# Patient Record
Sex: Female | Born: 1950
Health system: Southern US, Community
[De-identification: ages and names within clinical notes are randomized; demographics above are authoritative.]

## PROBLEM LIST (undated history)

## (undated) DIAGNOSIS — I10 Essential (primary) hypertension: Secondary | ICD-10-CM

## (undated) DIAGNOSIS — F17200 Nicotine dependence, unspecified, uncomplicated: Secondary | ICD-10-CM

## (undated) DIAGNOSIS — C801 Malignant (primary) neoplasm, unspecified: Secondary | ICD-10-CM

## (undated) DIAGNOSIS — E785 Hyperlipidemia, unspecified: Secondary | ICD-10-CM

## (undated) DIAGNOSIS — M1990 Unspecified osteoarthritis, unspecified site: Secondary | ICD-10-CM

## (undated) DIAGNOSIS — Z8489 Family history of other specified conditions: Secondary | ICD-10-CM

## (undated) HISTORY — PX: BLADDER SUSPENSION: SHX72

---

## 1998-04-27 ENCOUNTER — Other Ambulatory Visit: Admission: RE | Admit: 1998-04-27 | Discharge: 1998-04-27 | Payer: Self-pay | Admitting: Obstetrics and Gynecology

## 1999-09-27 ENCOUNTER — Other Ambulatory Visit: Admission: RE | Admit: 1999-09-27 | Discharge: 1999-09-27 | Payer: Self-pay | Admitting: Obstetrics and Gynecology

## 2001-12-11 ENCOUNTER — Other Ambulatory Visit: Admission: RE | Admit: 2001-12-11 | Discharge: 2001-12-11 | Payer: Self-pay | Admitting: Obstetrics and Gynecology

## 2001-12-17 ENCOUNTER — Ambulatory Visit (HOSPITAL_COMMUNITY): Admission: RE | Admit: 2001-12-17 | Discharge: 2001-12-17 | Payer: Self-pay | Admitting: Pulmonary Disease

## 2001-12-17 ENCOUNTER — Encounter: Payer: Self-pay | Admitting: Internal Medicine

## 2002-02-06 ENCOUNTER — Encounter (INDEPENDENT_AMBULATORY_CARE_PROVIDER_SITE_OTHER): Payer: Self-pay

## 2002-02-06 ENCOUNTER — Ambulatory Visit (HOSPITAL_COMMUNITY): Admission: RE | Admit: 2002-02-06 | Discharge: 2002-02-06 | Payer: Self-pay | Admitting: Gastroenterology

## 2002-03-28 ENCOUNTER — Encounter: Payer: Self-pay | Admitting: Urology

## 2002-03-28 ENCOUNTER — Encounter: Admission: RE | Admit: 2002-03-28 | Discharge: 2002-03-28 | Payer: Self-pay | Admitting: Urology

## 2004-04-07 ENCOUNTER — Other Ambulatory Visit: Admission: RE | Admit: 2004-04-07 | Discharge: 2004-04-07 | Payer: Self-pay | Admitting: Obstetrics and Gynecology

## 2009-07-27 ENCOUNTER — Ambulatory Visit (HOSPITAL_COMMUNITY): Admission: RE | Admit: 2009-07-27 | Discharge: 2009-07-27 | Payer: Self-pay | Admitting: Obstetrics and Gynecology

## 2010-08-15 LAB — URINALYSIS, ROUTINE W REFLEX MICROSCOPIC
Bilirubin Urine: NEGATIVE
Glucose, UA: NEGATIVE mg/dL
Hgb urine dipstick: NEGATIVE
Ketones, ur: NEGATIVE mg/dL
Nitrite: NEGATIVE
Protein, ur: NEGATIVE mg/dL
Specific Gravity, Urine: 1.01 (ref 1.005–1.030)
Urobilinogen, UA: 0.2 mg/dL (ref 0.0–1.0)
pH: 7 (ref 5.0–8.0)

## 2010-08-15 LAB — CBC
HCT: 42 % (ref 36.0–46.0)
Hemoglobin: 14.3 g/dL (ref 12.0–15.0)
MCHC: 34.2 g/dL (ref 30.0–36.0)
MCV: 94.9 fL (ref 78.0–100.0)
Platelets: 262 10*3/uL (ref 150–400)
RBC: 4.42 MIL/uL (ref 3.87–5.11)
RDW: 12.9 % (ref 11.5–15.5)
WBC: 8 10*3/uL (ref 4.0–10.5)

## 2013-08-19 ENCOUNTER — Other Ambulatory Visit: Payer: Self-pay | Admitting: Dermatology

## 2014-05-29 ENCOUNTER — Other Ambulatory Visit: Payer: Self-pay | Admitting: Dermatology

## 2015-03-16 DIAGNOSIS — L308 Other specified dermatitis: Secondary | ICD-10-CM | POA: Diagnosis not present

## 2015-03-16 DIAGNOSIS — L218 Other seborrheic dermatitis: Secondary | ICD-10-CM | POA: Diagnosis not present

## 2015-03-16 DIAGNOSIS — Z1231 Encounter for screening mammogram for malignant neoplasm of breast: Secondary | ICD-10-CM | POA: Diagnosis not present

## 2015-03-16 DIAGNOSIS — Z85828 Personal history of other malignant neoplasm of skin: Secondary | ICD-10-CM | POA: Diagnosis not present

## 2015-03-16 DIAGNOSIS — L821 Other seborrheic keratosis: Secondary | ICD-10-CM | POA: Diagnosis not present

## 2015-03-16 DIAGNOSIS — L603 Nail dystrophy: Secondary | ICD-10-CM | POA: Diagnosis not present

## 2015-03-16 DIAGNOSIS — L7211 Pilar cyst: Secondary | ICD-10-CM | POA: Diagnosis not present

## 2015-03-16 DIAGNOSIS — L812 Freckles: Secondary | ICD-10-CM | POA: Diagnosis not present

## 2015-03-18 DIAGNOSIS — Z01419 Encounter for gynecological examination (general) (routine) without abnormal findings: Secondary | ICD-10-CM | POA: Diagnosis not present

## 2015-03-18 DIAGNOSIS — Z124 Encounter for screening for malignant neoplasm of cervix: Secondary | ICD-10-CM | POA: Diagnosis not present

## 2015-03-18 DIAGNOSIS — Z6825 Body mass index (BMI) 25.0-25.9, adult: Secondary | ICD-10-CM | POA: Diagnosis not present

## 2015-04-21 DIAGNOSIS — Z23 Encounter for immunization: Secondary | ICD-10-CM | POA: Diagnosis not present

## 2015-04-21 DIAGNOSIS — R05 Cough: Secondary | ICD-10-CM | POA: Diagnosis not present

## 2015-05-25 DIAGNOSIS — H524 Presbyopia: Secondary | ICD-10-CM | POA: Diagnosis not present

## 2015-05-25 DIAGNOSIS — H5213 Myopia, bilateral: Secondary | ICD-10-CM | POA: Diagnosis not present

## 2015-06-04 DIAGNOSIS — R5383 Other fatigue: Secondary | ICD-10-CM | POA: Diagnosis not present

## 2015-06-04 DIAGNOSIS — Z13220 Encounter for screening for lipoid disorders: Secondary | ICD-10-CM | POA: Diagnosis not present

## 2015-06-04 DIAGNOSIS — Z0000 Encounter for general adult medical examination without abnormal findings: Secondary | ICD-10-CM | POA: Diagnosis not present

## 2015-06-04 DIAGNOSIS — M150 Primary generalized (osteo)arthritis: Secondary | ICD-10-CM | POA: Diagnosis not present

## 2015-06-15 DIAGNOSIS — B351 Tinea unguium: Secondary | ICD-10-CM | POA: Diagnosis not present

## 2015-06-15 DIAGNOSIS — Z23 Encounter for immunization: Secondary | ICD-10-CM | POA: Diagnosis not present

## 2015-06-15 DIAGNOSIS — M150 Primary generalized (osteo)arthritis: Secondary | ICD-10-CM | POA: Diagnosis not present

## 2015-06-15 DIAGNOSIS — R809 Proteinuria, unspecified: Secondary | ICD-10-CM | POA: Diagnosis not present

## 2015-06-15 DIAGNOSIS — R5383 Other fatigue: Secondary | ICD-10-CM | POA: Diagnosis not present

## 2016-03-14 DIAGNOSIS — D225 Melanocytic nevi of trunk: Secondary | ICD-10-CM | POA: Diagnosis not present

## 2016-03-14 DIAGNOSIS — L603 Nail dystrophy: Secondary | ICD-10-CM | POA: Diagnosis not present

## 2016-03-14 DIAGNOSIS — D1801 Hemangioma of skin and subcutaneous tissue: Secondary | ICD-10-CM | POA: Diagnosis not present

## 2016-03-14 DIAGNOSIS — L812 Freckles: Secondary | ICD-10-CM | POA: Diagnosis not present

## 2016-03-14 DIAGNOSIS — D224 Melanocytic nevi of scalp and neck: Secondary | ICD-10-CM | POA: Diagnosis not present

## 2016-03-14 DIAGNOSIS — Z85828 Personal history of other malignant neoplasm of skin: Secondary | ICD-10-CM | POA: Diagnosis not present

## 2016-04-25 DIAGNOSIS — Z23 Encounter for immunization: Secondary | ICD-10-CM | POA: Diagnosis not present

## 2016-06-01 DIAGNOSIS — R69 Illness, unspecified: Secondary | ICD-10-CM | POA: Diagnosis not present

## 2016-06-25 DIAGNOSIS — N390 Urinary tract infection, site not specified: Secondary | ICD-10-CM | POA: Diagnosis not present

## 2016-06-26 DIAGNOSIS — H52203 Unspecified astigmatism, bilateral: Secondary | ICD-10-CM | POA: Diagnosis not present

## 2016-06-26 DIAGNOSIS — H2513 Age-related nuclear cataract, bilateral: Secondary | ICD-10-CM | POA: Diagnosis not present

## 2016-07-26 DIAGNOSIS — Z13220 Encounter for screening for lipoid disorders: Secondary | ICD-10-CM | POA: Diagnosis not present

## 2016-07-26 DIAGNOSIS — R5383 Other fatigue: Secondary | ICD-10-CM | POA: Diagnosis not present

## 2016-07-26 DIAGNOSIS — M150 Primary generalized (osteo)arthritis: Secondary | ICD-10-CM | POA: Diagnosis not present

## 2016-08-02 DIAGNOSIS — Z0000 Encounter for general adult medical examination without abnormal findings: Secondary | ICD-10-CM | POA: Diagnosis not present

## 2016-08-02 DIAGNOSIS — R29898 Other symptoms and signs involving the musculoskeletal system: Secondary | ICD-10-CM | POA: Diagnosis not present

## 2016-08-02 DIAGNOSIS — R5383 Other fatigue: Secondary | ICD-10-CM | POA: Diagnosis not present

## 2016-08-02 DIAGNOSIS — R1033 Periumbilical pain: Secondary | ICD-10-CM | POA: Diagnosis not present

## 2016-08-02 DIAGNOSIS — M722 Plantar fascial fibromatosis: Secondary | ICD-10-CM | POA: Diagnosis not present

## 2016-08-02 DIAGNOSIS — Z23 Encounter for immunization: Secondary | ICD-10-CM | POA: Diagnosis not present

## 2016-09-08 ENCOUNTER — Encounter: Payer: Medicare HMO | Admitting: Neurology

## 2016-09-19 DIAGNOSIS — Z1231 Encounter for screening mammogram for malignant neoplasm of breast: Secondary | ICD-10-CM | POA: Diagnosis not present

## 2016-09-22 ENCOUNTER — Encounter (INDEPENDENT_AMBULATORY_CARE_PROVIDER_SITE_OTHER): Payer: Self-pay | Admitting: Neurology

## 2016-09-22 ENCOUNTER — Ambulatory Visit (INDEPENDENT_AMBULATORY_CARE_PROVIDER_SITE_OTHER): Payer: Medicare HMO | Admitting: Neurology

## 2016-09-22 DIAGNOSIS — Z0289 Encounter for other administrative examinations: Secondary | ICD-10-CM

## 2016-09-22 DIAGNOSIS — R531 Weakness: Secondary | ICD-10-CM | POA: Diagnosis not present

## 2016-09-22 NOTE — Procedures (Signed)
Full Name: Kristin Brennan Gender: Female MRN #: [id number] Date of Birth: 02-Dec-1950    Visit Date: 09/22/2016 09:10 Age: 66 Years 35 Months Old Examining Physician: Marcial Pacas, MD  History: 66 year old female, presenting with bilateral upper extremity weakness left worse than right.   Summary of the test:  Nerve conduction study:  Bilateral upper extremity sensory and motor nerve conduction studies were normal.   Electromyography: Selected needle examination of bilateral upper extremity and cervical paraspinal muscles were normal.  Conclusion: This is a normal study. There is no electrodiagnostic evidence of upper extremity neuropathy; there is no evidence of bilateral cervical radiculopathy or inflammatory myopathy.    ------------------------------- Marcial Pacas, M.D.  [facility] [place], [place] [address] Tel: [phone] Fax: [phone]        [facility]    Nerve / Sites Rec. Site Latency Ref. Amplitude Ref. Rel Amp Segments Distance Velocity Ref. Area    ms ms mV mV %  cm m/s m/s mVms  R Median - APB     Wrist APB 3.6 ?4.4 5.0 ?4.0 100 Wrist - APB 7   19.7     Upper arm APB 7.6  4.8  96.9 Upper arm - Wrist 22 56 ?49 19.5  L Median - APB     Wrist APB 3.1 ?4.4 6.5 ?4.0 100 Wrist - APB 7   25.1     Upper arm APB 7.0  6.4  98.6 Upper arm - Wrist 22 56 ?49 24.0  R Ulnar - ADM     Wrist ADM 2.8 ?3.3 9.6 ?6.0 100 Wrist - ADM 7   30.0     B.Elbow ADM 6.1  8.9  91.8 B.Elbow - Wrist 18 53 ?49 28.2     A.Elbow ADM 8.2  9.2  103 A.Elbow - B.Elbow 11 53 ?49 28.4     Axilla ADM 10.9  8.3  91.1 A.Elbow - Wrist    26.8  L Ulnar - ADM     Wrist ADM 2.6 ?3.3 10.7 ?6.0 100 Wrist - ADM 7   27.7     B.Elbow ADM 5.9  10.0  93.8 B.Elbow - Wrist 19 57 ?49 27.3     A.Elbow ADM 7.7  9.9  98.9 A.Elbow - B.Elbow 10 55 ?49 27.2     Axilla ADM 11.5  8.0  80.1 A.Elbow - Wrist    23.5             SNC    Nerve / Sites Rec. Site Peak Lat Ref.  Amp Ref.  Segments Distance    ms ms V V  cm  R Median - Orthodromic (Dig II, Mid palm)     Dig II Wrist 3.18 ?3.40 10 ?10 Dig II - Wrist 13  L Median - Orthodromic (Dig II, Mid palm)     Dig II Wrist 3.13 ?3.40 18 ?10 Dig II - Wrist 13  R Ulnar - Orthodromic, (Dig V, Mid palm)     Dig V Wrist 2.81 ?3.10 7 ?5 Dig V - Wrist 11  L Ulnar - Orthodromic, (Dig V, Mid palm)     Dig V Wrist 3.02 ?3.10 7 ?5 Dig V - Wrist 68             F  Wave    Nerve F Lat Ref.   ms ms  R Ulnar - ADM 28.1 ?32.0  L Ulnar - ADM 28.9 ?32.0  EMG full       EMG Summary Table    Spontaneous MUAP Recruitment  Muscle IA Fib PSW Fasc Other Amp Dur. Poly Pattern  L. First dorsal interosseous Normal None None None _______ Normal Normal Normal Normal  L. Deltoid Normal None None None _______ Normal Normal Normal Normal  L. Biceps brachii Normal None None None _______ Normal Normal Normal Normal  L. Triceps brachii Normal None None None _______ Normal Normal Normal Normal  L. Extensor digitorum communis Normal None None None _______ Normal Normal Normal Normal  R. First dorsal interosseous Normal None None None _______ Normal Normal Normal Normal  R. Pronator teres Normal None None None _______ Normal Normal Normal Normal  R. Deltoid Normal None None None _______ Normal Normal Normal Normal  R. Biceps brachii Normal None None None _______ Normal Normal Normal Normal  R. Triceps brachii Normal None None None _______ Normal Normal Normal Normal  L. Cervical paraspinals Normal None None None _______ Normal Normal Normal Normal

## 2016-09-25 DIAGNOSIS — N6489 Other specified disorders of breast: Secondary | ICD-10-CM | POA: Diagnosis not present

## 2016-09-25 DIAGNOSIS — N6310 Unspecified lump in the right breast, unspecified quadrant: Secondary | ICD-10-CM | POA: Diagnosis not present

## 2016-09-25 DIAGNOSIS — R928 Other abnormal and inconclusive findings on diagnostic imaging of breast: Secondary | ICD-10-CM | POA: Diagnosis not present

## 2016-09-25 DIAGNOSIS — R920 Mammographic microcalcification found on diagnostic imaging of breast: Secondary | ICD-10-CM | POA: Diagnosis not present

## 2016-09-26 DIAGNOSIS — N952 Postmenopausal atrophic vaginitis: Secondary | ICD-10-CM | POA: Diagnosis not present

## 2016-09-26 DIAGNOSIS — Z01419 Encounter for gynecological examination (general) (routine) without abnormal findings: Secondary | ICD-10-CM | POA: Diagnosis not present

## 2016-09-26 DIAGNOSIS — Z6826 Body mass index (BMI) 26.0-26.9, adult: Secondary | ICD-10-CM | POA: Diagnosis not present

## 2016-10-06 DIAGNOSIS — R29898 Other symptoms and signs involving the musculoskeletal system: Secondary | ICD-10-CM | POA: Diagnosis not present

## 2016-10-06 DIAGNOSIS — R809 Proteinuria, unspecified: Secondary | ICD-10-CM | POA: Diagnosis not present

## 2016-10-10 DIAGNOSIS — R69 Illness, unspecified: Secondary | ICD-10-CM | POA: Diagnosis not present

## 2016-10-10 DIAGNOSIS — R29898 Other symptoms and signs involving the musculoskeletal system: Secondary | ICD-10-CM | POA: Diagnosis not present

## 2016-10-10 DIAGNOSIS — R809 Proteinuria, unspecified: Secondary | ICD-10-CM | POA: Diagnosis not present

## 2016-10-20 ENCOUNTER — Other Ambulatory Visit: Payer: Self-pay | Admitting: General Surgery

## 2016-10-20 DIAGNOSIS — R921 Mammographic calcification found on diagnostic imaging of breast: Secondary | ICD-10-CM

## 2016-10-24 ENCOUNTER — Ambulatory Visit
Admission: RE | Admit: 2016-10-24 | Discharge: 2016-10-24 | Disposition: A | Discharge status: Home / Self Care | Payer: Medicare HMO | Source: Ambulatory Visit | Attending: General Surgery | Admitting: General Surgery

## 2016-10-24 DIAGNOSIS — R921 Mammographic calcification found on diagnostic imaging of breast: Secondary | ICD-10-CM

## 2016-10-24 DIAGNOSIS — D0511 Intraductal carcinoma in situ of right breast: Secondary | ICD-10-CM | POA: Diagnosis not present

## 2016-10-30 DIAGNOSIS — D0511 Intraductal carcinoma in situ of right breast: Secondary | ICD-10-CM | POA: Diagnosis not present

## 2016-10-30 DIAGNOSIS — N61 Mastitis without abscess: Secondary | ICD-10-CM | POA: Diagnosis not present

## 2016-11-01 ENCOUNTER — Telehealth: Payer: Self-pay | Admitting: Genetics

## 2016-11-01 NOTE — Telephone Encounter (Signed)
Appt has been scheduled for the pt to see Vicente Males on 6/14 at 3pm. Pt has also been scheduled to see Dr. Lindi Adie on 7/2 at 345pm. Pt agreed to the appt date and time.

## 2016-11-02 ENCOUNTER — Encounter: Payer: Self-pay | Admitting: Genetics

## 2016-11-02 ENCOUNTER — Other Ambulatory Visit: Payer: Medicare HMO

## 2016-11-02 ENCOUNTER — Ambulatory Visit (HOSPITAL_BASED_OUTPATIENT_CLINIC_OR_DEPARTMENT_OTHER): Payer: Medicare HMO | Admitting: Genetics

## 2016-11-02 DIAGNOSIS — Z803 Family history of malignant neoplasm of breast: Secondary | ICD-10-CM | POA: Diagnosis not present

## 2016-11-02 DIAGNOSIS — C50911 Malignant neoplasm of unspecified site of right female breast: Secondary | ICD-10-CM | POA: Diagnosis not present

## 2016-11-02 NOTE — Progress Notes (Signed)
REFERRING PROVIDER: Rolm Bookbinder, MD [address] [place], [place] [address]  PRIMARY PROVIDER:  Merrilee Seashore, MD  PRIMARY REASON FOR VISIT:  1. Malignant neoplasm of right female breast, unspecified estrogen receptor status, unspecified site of breast ([place])   2. Family history of breast cancer      HISTORY OF PRESENT ILLNESS:   Kristin Brennan, a 66 y.o. female, was seen for a [facility] cancer genetics consultation at the request of Dr. Donne Hazel due to a personal and family history of cancer.  Kristin Brennan presents to clinic today to discuss the possibility of a hereditary predisposition to cancer, genetic testing, and to further clarify her future cancer risks, as well as potential cancer risks for family members.   CANCER HISTORY: On 10/24/2016, at the age of 65, Kristin Brennan was diagnosed with ductal carcinoma in situ of the right breast. Her treatment is pending. She meets with Dr. Lindi Adie 11/20/2016 to discuss treatment further. Kristin Brennan reports that she has no other personal history of cancer, though she has had many skin lesions removed and is followed closely by dermatology. Kristin Brennan reports that her most recent colonoscopy was about 5 years ago and she has had some polyps in the past. She states that she is due for her next colonoscopy.  No past medical history on file.  No past surgical history on file.  Social History   Social History  . Marital status: Married    Spouse name: N/A  . Number of children: N/A  . Years of education: N/A   Social History Main Topics  . Smoking status: Not on file  . Smokeless tobacco: Not on file  . Alcohol use Not on file  . Drug use: Unknown  . Sexual activity: Not on file   Other Topics Concern  . Not on file   Social History Narrative  . No narrative on file     FAMILY HISTORY:  We obtained a detailed, 4-generation family history.  Significant diagnoses are listed  below: Family History  Problem Relation Age of Onset  . Breast cancer Mother 5       d.86  . Prostate cancer Father 54       d.66  . Lung cancer Father 64  . Brennan cancer Maternal Aunt        d.72  . Prostate cancer Maternal Uncle 82       d.82s  . Breast cancer Maternal Grandmother 88       d.92s  . Breast cancer Cousin 58       daughter of unaffected maternal uncle Kristin Brennan  . Breast cancer Cousin 72       daughter of unaffected maternal uncle Kristin Brennan   Kristin Brennan has a son, age 85. She also has a sister, age 13. Her son and sister are cancer-free.  Kristin Brennan reports that her mother likely had breast cancer at age 28 and died at 70. Kristin Brennan reports that her mother had a lesion on her scalp that would not heal. When it was biopsied, it suggested she had breast or uterine cancer. Her mother was put on tamoxifen at that time and underwent regular breast cancer screenings though a breast cancer primary was never identified. Kristin Brennan had three maternal uncles and three maternal aunts. Her uncle Kristin Brennan had prostate cancer and died in his early-80s. Her aunt Kristin Brennan had Brennan cancer and died at 8. The rest of her maternal aunts and uncles did not have  cancers. However, her uncle 66 daughter, Kristin Brennan, and uncle Kristin Brennan's daughter, Kristin Brennan, have both had breast cancer around the age of 53 and are currently in their mid-60s. Kristin Brennan' maternal grandmother was diagnosed with breast cancer in her late-80s and died in her early-90s. This grandmother's sister also had breast cancer. Kristin Brennan' maternal grandfather died in his late-70s without cancers.  Kristin Brennan' father was diagnosed with prostate and lung cancer at age 24 and died the same year. She has three paternal aunts and two paternal uncles. None of her paternal aunts or uncles had cancers, though two aunts died in their 66s and 70s. One aunt died in childbirth. The other aunt died of unknown cause.  Kristin Brennan does not know of any cancers in her paternal grandparents.  Ms. Mesquita is unaware of previous family history of genetic testing for hereditary cancer risks. Patient's maternal and paternal ancestors are of [place] descent. There is no reported Ashkenazi Jewish ancestry. There is no known consanguinity.  GENETIC COUNSELING ASSESSMENT: Kristin Brennan is a 66 y.o. female with a personal and family which is somewhat suggestive of a hereditary cancer syndrome and predisposition to cancer. We, therefore, discussed and recommended the following at today's visit.   DISCUSSION: We reviewed the characteristics, features and inheritance patterns of hereditary cancer syndromes. We also discussed genetic testing, including the appropriate family members to test, the process of testing, insurance coverage and turn-around-time for results. We discussed the implications of a negative, positive and/or variant of uncertain significant result. We recommended Kristin Brennan pursue genetic testing for the Common Hereditary Cancers Panel offered by [facility]. [facility]'s Common Hereditary Cancers Panel includes analysis of the following 46 genes: APC, ATM, AXIN2, BARD1, BMPR1A, BRCA1, BRCA2, BRIP1, CDH1, CDKN2A, CHEK2, CTNNA1, DICER1, EPCAM, GREM1, HOXB13, KIT, MEN1, MLH1, MSH2, MSH3, MSH6, MUTYH, NBN, NF1, NTHL1, PALB2, PDGFRA, PMS2, POLD1, POLE, PTEN, RAD50, RAD51C, RAD51D, SDHA, SDHB, SDHC, SDHD, SMAD4, SMARCA4, STK11, TP53, TSC1, TSC2, and VHL.   Based on Kristin Brennan's personal and family history of cancer, she meets medical criteria for genetic testing. Despite that she meets criteria, she may still have an out of pocket cost. We discussed that if her out of pocket cost for testing is over $100, the laboratory will call and confirm whether she wants to proceed with testing.  If the out of pocket cost of testing is less than $100 she will be billed by the genetic testing laboratory.    PLAN: Despite our recommendation, Kristin Brennan did not wish to pursue genetic testing at today's visit. She indicated that the results of her genetic testing are unlikely to influence her surgical decisions at this point (she thinks that even if she were to have a high-risk mutation, she would elect for high-risk screening rather than risk-reducing mastectomies). Though she may want to pursue testing in the future, she does not feel ready to pursue it at this time. We understand this decision, and remain available to coordinate genetic testing at any time in the future. Ms. Shuford was encouraged to continue to follow the cancer screening and treatment guidelines given by her primary healthcare provider and oncology team.  Lastly, we encouraged Kristin Brennan to remain in contact with cancer genetics annually so that we can continuously update the family history and inform her of any changes in cancer genetics and testing that may be of benefit for this family.   Ms.  Germani questions were answered to her satisfaction today. Our contact information was provided should additional  questions or concerns arise. Thank you for the referral and allowing [place] to share in the care of your patient.   Mal Misty, MS, [facility] Certified Naval [email or web address] phone: [phone]  The patient was seen for a total of 45 minutes in face-to-face genetic counseling.  This patient was discussed with Drs. Magrinat, Lindi Adie and/or Burr Medico who agrees with the above.    _______________________________________________________________________ For Office Staff:  Number of people involved in session: 1 Was an Intern/ student involved with case: no

## 2016-11-03 HISTORY — PX: BREAST BIOPSY: SHX20

## 2016-11-19 DIAGNOSIS — C801 Malignant (primary) neoplasm, unspecified: Secondary | ICD-10-CM

## 2016-11-19 HISTORY — DX: Malignant (primary) neoplasm, unspecified: C80.1

## 2016-11-19 HISTORY — PX: BREAST LUMPECTOMY: SHX2

## 2016-11-20 ENCOUNTER — Encounter: Payer: Self-pay | Admitting: Hematology and Oncology

## 2016-11-20 ENCOUNTER — Ambulatory Visit (HOSPITAL_BASED_OUTPATIENT_CLINIC_OR_DEPARTMENT_OTHER): Payer: Medicare HMO | Admitting: Hematology and Oncology

## 2016-11-20 ENCOUNTER — Encounter: Payer: Self-pay | Admitting: *Deleted

## 2016-11-20 DIAGNOSIS — C50411 Malignant neoplasm of upper-outer quadrant of right female breast: Secondary | ICD-10-CM | POA: Insufficient documentation

## 2016-11-20 DIAGNOSIS — D0511 Intraductal carcinoma in situ of right breast: Secondary | ICD-10-CM

## 2016-11-20 NOTE — Progress Notes (Signed)
[facility] CONSULT NOTE  Patient Care Team: Merrilee Seashore, MD as PCP - General (Internal Medicine)  CHIEF COMPLAINTS/PURPOSE OF CONSULTATION:  Newly diagnosed right breast DCIS  HISTORY OF PRESENTING ILLNESS:  Kristin Brennan 66 y.o. female is here because of recent diagnosis of right breast DCIS. Patient had a screening mammogram at [facility] that revealed a 7 mm area of calcifications in the upper outer quadrant of the right breast. Stereotactic biopsy revealed DCIS with calcifications intermediate grade that was ER/PR negative. She was seen by Dr. Donne Hazel who recommended lumpectomy and referred her to [place] for discussion regarding adjuvant treatment options. Patient has a very severe upper respiratory infection with severe cough. She denies any fevers or chills.  I reviewed her records extensively and collaborated the history with the patient.  SUMMARY OF ONCOLOGIC HISTORY:   Ductal carcinoma in situ (DCIS) of right breast   10/24/2016 Initial Diagnosis    Screening mammogram at [facility] revealed 3 x 5 x 7 mm calcifications upper outer quadrant right breast;  stereotactic biopsy revealed DCIS with calcifications, intermediate grade, ER 0%, PR 0%, Tis Nx stage 0      MEDICAL HISTORY: No prior medical problems  SURGICAL HISTORY: Polyp removal with the colonoscopy SOCIAL HISTORY: Current smoker, drinks alcohol moderately, denies any recreational drug use FAMILY HISTORY: Family History  Problem Relation Age of Onset  . Breast cancer Mother 38       d.86  . Prostate cancer Father 73       d.66  . Lung cancer Father 55  . Kidney cancer Maternal Aunt        d.72  . Prostate cancer Maternal Uncle 82       d.82s  . Breast cancer Maternal Grandmother 88       d.92s  . Breast cancer Cousin 63       daughter of unaffected maternal uncle Linna Hoff  . Breast cancer Cousin 65       daughter of unaffected maternal uncle John    ALLERGIES:  has no allergies on  file.  MEDICATIONS: Does not take any medications REVIEW OF SYSTEMS:   Constitutional: Denies fevers, chills or abnormal night sweats Eyes: Denies blurriness of vision, double vision or watery eyes Ears, nose, mouth, throat, and face: Cough with expectoration Respiratory: Denies cough, dyspnea or wheezes Cardiovascular: Denies palpitation, chest discomfort or lower extremity swelling Gastrointestinal:  Denies nausea, heartburn or change in bowel habits Skin: Denies abnormal skin rashes Lymphatics: Denies new lymphadenopathy or easy bruising Neurological:Denies numbness, tingling or new weaknesses Behavioral/Psych: Mood is stable, no new changes  Breast:  Denies any palpable lumps or discharge All other systems were reviewed with the patient and are negative.  PHYSICAL EXAMINATION: ECOG PERFORMANCE STATUS: 1 - Symptomatic but completely ambulatory  Vitals:   11/20/16 1546  BP: (!) 141/85  Pulse: 62  Resp: 18  Temp: 98.4 F (36.9 C)   Filed Weights   11/20/16 1546  Weight: 153 lb 4.8 oz (69.5 kg)    GENERAL:alert, no distress and comfortable SKIN: skin color, texture, turgor are normal, no rashes or significant lesions EYES: normal, conjunctiva are pink and non-injected, sclera clear OROPHARYNX:no exudate, no erythema and lips, buccal mucosa, and tongue normal  NECK: supple, thyroid normal size, non-tender, without nodularity LYMPH:  no palpable lymphadenopathy in the cervical, axillary or inguinal LUNGS: clear to auscultation and percussion with normal breathing effort HEART: regular rate & rhythm and no murmurs and no lower extremity  edema ABDOMEN:abdomen soft, non-tender and normal bowel sounds Musculoskeletal:no cyanosis of digits and no clubbing  PSYCH: alert & oriented x 3 with fluent speech NEURO: no focal motor/sensory deficits BREAST: No palpable nodules in breast. No palpable axillary or supraclavicular lymphadenopathy (exam performed in the presence of a  chaperone)   LABORATORY DATA:  I have reviewed the data as listed Lab Results  Component Value Date   WBC 8.0 07/26/2009   HGB 14.3 07/26/2009   HCT 42.0 07/26/2009   MCV 94.9 07/26/2009   PLT 262 07/26/2009   No results found for: NA, K, CL, CO2  RADIOGRAPHIC STUDIES: I have personally reviewed the radiological reports and agreed with the findings in the report.  ASSESSMENT AND PLAN:  Ductal carcinoma in situ (DCIS) of right breast 10/23/2016: Screening mammogram at [facility] revealed 3 x 5 x 7 mm calcifications upper outer quadrant right breast;  stereotactic biopsy revealed DCIS with calcifications, intermediate grade, ER 0%, PR 0%, Tis Nx stage 0  Pathology review: I discussed with the patient the difference between DCIS and invasive breast cancer. It is considered a precancerous lesion. DCIS is classified as a 0. It is generally detected through mammograms as calcifications. We discussed the significance of grades and its impact on prognosis. We also discussed the importance of ER and PR receptors. Prognosis of DCIS dependence on grade, comedo necrosis. It is anticipated that if not treated, 20-30% of DCIS can develop into invasive breast cancer.  Recommendation: 1. Breast conserving surgery 2. Followed by adjuvant radiation therapy  Return to clinic after surgery to discuss the final pathology report and come up with an adjuvant treatment plan.  All questions were answered. The patient knows to call the clinic with any problems, questions or concerns.    Rulon Eisenmenger, MD 11/20/16

## 2016-11-20 NOTE — Assessment & Plan Note (Addendum)
10/23/2016: Screening mammogram at [facility] revealed 3 x 5 x 7 mm calcifications upper outer quadrant right breast;  stereotactic biopsy revealed DCIS with calcifications, intermediate grade, ER 0%, PR 0%, Tis Nx stage 0  Pathology review: I discussed with the patient the difference between DCIS and invasive breast cancer. It is considered a precancerous lesion. DCIS is classified as a 0. It is generally detected through mammograms as calcifications. We discussed the significance of grades and its impact on prognosis. We also discussed the importance of ER and PR receptors. Prognosis of DCIS dependence on grade, comedo necrosis. It is anticipated that if not treated, 20-30% of DCIS can develop into invasive breast cancer.  Recommendation: 1. Breast conserving surgery 2. Followed by adjuvant radiation therapy  Return to clinic after surgery to discuss the final pathology report and come up with an adjuvant treatment plan.

## 2016-11-21 DIAGNOSIS — J0100 Acute maxillary sinusitis, unspecified: Secondary | ICD-10-CM | POA: Diagnosis not present

## 2016-11-23 ENCOUNTER — Other Ambulatory Visit: Payer: Self-pay | Admitting: General Surgery

## 2016-11-23 DIAGNOSIS — D0511 Intraductal carcinoma in situ of right breast: Secondary | ICD-10-CM

## 2016-12-01 ENCOUNTER — Other Ambulatory Visit: Payer: Self-pay | Admitting: *Deleted

## 2016-12-01 DIAGNOSIS — D0511 Intraductal carcinoma in situ of right breast: Secondary | ICD-10-CM

## 2016-12-02 ENCOUNTER — Telehealth: Payer: Self-pay | Admitting: Hematology and Oncology

## 2016-12-02 NOTE — Telephone Encounter (Signed)
lvm to iform of 8/7 appt per sch msg

## 2016-12-06 DIAGNOSIS — R69 Illness, unspecified: Secondary | ICD-10-CM | POA: Diagnosis not present

## 2016-12-11 ENCOUNTER — Encounter (HOSPITAL_BASED_OUTPATIENT_CLINIC_OR_DEPARTMENT_OTHER): Payer: Self-pay | Admitting: *Deleted

## 2016-12-11 DIAGNOSIS — C50411 Malignant neoplasm of upper-outer quadrant of right female breast: Secondary | ICD-10-CM | POA: Diagnosis not present

## 2016-12-11 DIAGNOSIS — Z780 Asymptomatic menopausal state: Secondary | ICD-10-CM | POA: Diagnosis not present

## 2016-12-11 DIAGNOSIS — Z171 Estrogen receptor negative status [ER-]: Secondary | ICD-10-CM | POA: Diagnosis not present

## 2016-12-11 DIAGNOSIS — Z803 Family history of malignant neoplasm of breast: Secondary | ICD-10-CM | POA: Diagnosis not present

## 2016-12-11 DIAGNOSIS — R69 Illness, unspecified: Secondary | ICD-10-CM | POA: Diagnosis not present

## 2016-12-11 DIAGNOSIS — D0511 Intraductal carcinoma in situ of right breast: Secondary | ICD-10-CM | POA: Diagnosis not present

## 2016-12-11 DIAGNOSIS — Z8042 Family history of malignant neoplasm of prostate: Secondary | ICD-10-CM | POA: Diagnosis not present

## 2016-12-15 ENCOUNTER — Ambulatory Visit
Admission: RE | Admit: 2016-12-15 | Discharge: 2016-12-15 | Disposition: A | Discharge status: Home / Self Care | Payer: Medicare HMO | Source: Ambulatory Visit | Attending: General Surgery | Admitting: General Surgery

## 2016-12-15 DIAGNOSIS — C50411 Malignant neoplasm of upper-outer quadrant of right female breast: Secondary | ICD-10-CM | POA: Diagnosis not present

## 2016-12-15 DIAGNOSIS — D0511 Intraductal carcinoma in situ of right breast: Secondary | ICD-10-CM

## 2016-12-15 NOTE — Progress Notes (Signed)
Boost drink given with instructions to complete by 0615 dos, pt verbalized understanding. 

## 2016-12-18 ENCOUNTER — Ambulatory Visit (HOSPITAL_BASED_OUTPATIENT_CLINIC_OR_DEPARTMENT_OTHER): Payer: Medicare HMO | Admitting: Certified Registered"

## 2016-12-18 ENCOUNTER — Ambulatory Visit (HOSPITAL_BASED_OUTPATIENT_CLINIC_OR_DEPARTMENT_OTHER)
Admission: RE | Admit: 2016-12-18 | Discharge: 2016-12-18 | Disposition: A | Discharge status: Home / Self Care | Payer: Medicare HMO | Source: Ambulatory Visit | Attending: General Surgery | Admitting: General Surgery

## 2016-12-18 ENCOUNTER — Ambulatory Visit
Admission: RE | Admit: 2016-12-18 | Discharge: 2016-12-18 | Disposition: A | Discharge status: Home / Self Care | Payer: Medicare HMO | Source: Ambulatory Visit | Attending: General Surgery | Admitting: General Surgery

## 2016-12-18 ENCOUNTER — Encounter (HOSPITAL_BASED_OUTPATIENT_CLINIC_OR_DEPARTMENT_OTHER)
Admission: RE | Disposition: A | Discharge status: Home / Self Care | Payer: Self-pay | Source: Ambulatory Visit | Attending: General Surgery

## 2016-12-18 ENCOUNTER — Encounter (HOSPITAL_BASED_OUTPATIENT_CLINIC_OR_DEPARTMENT_OTHER): Payer: Self-pay | Admitting: *Deleted

## 2016-12-18 DIAGNOSIS — F17200 Nicotine dependence, unspecified, uncomplicated: Secondary | ICD-10-CM | POA: Diagnosis not present

## 2016-12-18 DIAGNOSIS — Z8249 Family history of ischemic heart disease and other diseases of the circulatory system: Secondary | ICD-10-CM | POA: Insufficient documentation

## 2016-12-18 DIAGNOSIS — Z836 Family history of other diseases of the respiratory system: Secondary | ICD-10-CM | POA: Insufficient documentation

## 2016-12-18 DIAGNOSIS — R69 Illness, unspecified: Secondary | ICD-10-CM | POA: Diagnosis not present

## 2016-12-18 DIAGNOSIS — D0501 Lobular carcinoma in situ of right breast: Secondary | ICD-10-CM | POA: Diagnosis not present

## 2016-12-18 DIAGNOSIS — N62 Hypertrophy of breast: Secondary | ICD-10-CM | POA: Diagnosis not present

## 2016-12-18 DIAGNOSIS — Z841 Family history of disorders of kidney and ureter: Secondary | ICD-10-CM | POA: Diagnosis not present

## 2016-12-18 DIAGNOSIS — M1990 Unspecified osteoarthritis, unspecified site: Secondary | ICD-10-CM | POA: Insufficient documentation

## 2016-12-18 DIAGNOSIS — Z803 Family history of malignant neoplasm of breast: Secondary | ICD-10-CM | POA: Insufficient documentation

## 2016-12-18 DIAGNOSIS — D0511 Intraductal carcinoma in situ of right breast: Secondary | ICD-10-CM | POA: Diagnosis not present

## 2016-12-18 DIAGNOSIS — R921 Mammographic calcification found on diagnostic imaging of breast: Secondary | ICD-10-CM | POA: Diagnosis not present

## 2016-12-18 DIAGNOSIS — Z8042 Family history of malignant neoplasm of prostate: Secondary | ICD-10-CM | POA: Insufficient documentation

## 2016-12-18 DIAGNOSIS — Z8371 Family history of colonic polyps: Secondary | ICD-10-CM | POA: Diagnosis not present

## 2016-12-18 HISTORY — DX: Unspecified osteoarthritis, unspecified site: M19.90

## 2016-12-18 HISTORY — DX: Malignant (primary) neoplasm, unspecified: C80.1

## 2016-12-18 HISTORY — PX: BREAST LUMPECTOMY WITH RADIOACTIVE SEED LOCALIZATION: SHX6424

## 2016-12-18 HISTORY — DX: Nicotine dependence, unspecified, uncomplicated: F17.200

## 2016-12-18 SURGERY — BREAST LUMPECTOMY WITH RADIOACTIVE SEED LOCALIZATION
Anesthesia: General | Site: Breast | Laterality: Right

## 2016-12-18 MED ORDER — CHLORHEXIDINE GLUCONATE CLOTH 2 % EX PADS: 6.0000 | MEDICATED_PAD | Freq: Once | CUTANEOUS | Status: DC

## 2016-12-18 MED ORDER — CEFAZOLIN SODIUM-DEXTROSE 2-4 GM/100ML-% IV SOLN
2.0000 g | INTRAVENOUS | Status: AC
  Administered 2016-12-18: 2 g via INTRAVENOUS

## 2016-12-18 MED ORDER — ACETAMINOPHEN 500 MG PO TABS
ORAL_TABLET | ORAL | Status: AC
  Filled 2016-12-18: qty 2

## 2016-12-18 MED ORDER — BUPIVACAINE HCL (PF) 0.25 % IJ SOLN
INTRAMUSCULAR | Status: DC | PRN
  Administered 2016-12-18: 10 mL

## 2016-12-18 MED ORDER — ACETAMINOPHEN 500 MG PO TABS
1000.0000 mg | ORAL_TABLET | ORAL | Status: AC
  Administered 2016-12-18: 1000 mg via ORAL

## 2016-12-18 MED ORDER — MIDAZOLAM HCL 2 MG/2ML IJ SOLN: 1.0000 mg | INTRAMUSCULAR | Status: DC | PRN

## 2016-12-18 MED ORDER — FENTANYL CITRATE (PF) 100 MCG/2ML IJ SOLN
50.0000 ug | INTRAMUSCULAR | Status: DC | PRN
  Administered 2016-12-18: 50 ug via INTRAVENOUS

## 2016-12-18 MED ORDER — ONDANSETRON HCL 4 MG/2ML IJ SOLN
INTRAMUSCULAR | Status: AC
  Filled 2016-12-18: qty 14

## 2016-12-18 MED ORDER — EPHEDRINE 5 MG/ML INJ
INTRAVENOUS | Status: AC
  Filled 2016-12-18: qty 20

## 2016-12-18 MED ORDER — PROPOFOL 10 MG/ML IV BOLUS
INTRAVENOUS | Status: DC | PRN
  Administered 2016-12-18: 150 mg via INTRAVENOUS

## 2016-12-18 MED ORDER — ONDANSETRON HCL 4 MG/2ML IJ SOLN
INTRAMUSCULAR | Status: DC | PRN
  Administered 2016-12-18: 4 mg via INTRAVENOUS

## 2016-12-18 MED ORDER — FENTANYL CITRATE (PF) 100 MCG/2ML IJ SOLN
INTRAMUSCULAR | Status: AC
  Filled 2016-12-18: qty 2

## 2016-12-18 MED ORDER — DEXAMETHASONE SODIUM PHOSPHATE 10 MG/ML IJ SOLN
INTRAMUSCULAR | Status: AC
  Filled 2016-12-18: qty 3

## 2016-12-18 MED ORDER — OXYCODONE HCL 5 MG PO TABS: 5.0000 mg | ORAL_TABLET | Freq: Once | ORAL | Status: DC | PRN

## 2016-12-18 MED ORDER — OXYCODONE HCL 5 MG/5ML PO SOLN: 5.0000 mg | Freq: Once | ORAL | Status: DC | PRN

## 2016-12-18 MED ORDER — FENTANYL CITRATE (PF) 100 MCG/2ML IJ SOLN
25.0000 ug | INTRAMUSCULAR | Status: DC | PRN
  Administered 2016-12-18: 50 ug via INTRAVENOUS

## 2016-12-18 MED ORDER — GABAPENTIN 300 MG PO CAPS
ORAL_CAPSULE | ORAL | Status: AC
  Filled 2016-12-18: qty 1

## 2016-12-18 MED ORDER — LIDOCAINE 2% (20 MG/ML) 5 ML SYRINGE
INTRAMUSCULAR | Status: AC
  Filled 2016-12-18: qty 15

## 2016-12-18 MED ORDER — DEXAMETHASONE SODIUM PHOSPHATE 4 MG/ML IJ SOLN
INTRAMUSCULAR | Status: DC | PRN
  Administered 2016-12-18: 10 mg via INTRAVENOUS

## 2016-12-18 MED ORDER — GABAPENTIN 300 MG PO CAPS
300.0000 mg | ORAL_CAPSULE | ORAL | Status: AC
  Administered 2016-12-18: 300 mg via ORAL

## 2016-12-18 MED ORDER — LACTATED RINGERS IV SOLN
INTRAVENOUS | Status: DC
  Administered 2016-12-18: 09:00:00 via INTRAVENOUS

## 2016-12-18 MED ORDER — TRAMADOL HCL 50 MG PO TABS: 50.0000 mg | ORAL_TABLET | Freq: Four times a day (QID) | ORAL | 0 refills | Status: DC | PRN

## 2016-12-18 MED ORDER — LIDOCAINE HCL (CARDIAC) 20 MG/ML IV SOLN
INTRAVENOUS | Status: DC | PRN
  Administered 2016-12-18: 60 mg via INTRAVENOUS

## 2016-12-18 MED ORDER — SCOPOLAMINE 1 MG/3DAYS TD PT72: 1.0000 | MEDICATED_PATCH | Freq: Once | TRANSDERMAL | Status: DC | PRN

## 2016-12-18 SURGICAL SUPPLY — 38 items
BINDER BREAST XLRG (GAUZE/BANDAGES/DRESSINGS) ×2 IMPLANT
BLADE SURG 15 STRL LF DISP TIS (BLADE) ×1 IMPLANT
BLADE SURG 15 STRL SS (BLADE) ×1
CHLORAPREP W/TINT 26ML (MISCELLANEOUS) ×2 IMPLANT
CLIP VESOCCLUDE SM WIDE 6/CT (CLIP) ×2 IMPLANT
COVER BACK TABLE 60X90IN (DRAPES) ×2 IMPLANT
COVER MAYO STAND STRL (DRAPES) ×2 IMPLANT
COVER PROBE W GEL 5X96 (DRAPES) ×2 IMPLANT
DERMABOND ADVANCED (GAUZE/BANDAGES/DRESSINGS) ×1
DERMABOND ADVANCED .7 DNX12 (GAUZE/BANDAGES/DRESSINGS) ×1 IMPLANT
DEVICE DUBIN W/COMP PLATE 8390 (MISCELLANEOUS) ×2 IMPLANT
DRAPE LAPAROSCOPIC ABDOMINAL (DRAPES) ×2 IMPLANT
DRAPE UTILITY XL STRL (DRAPES) ×2 IMPLANT
ELECT COATED BLADE 2.86 ST (ELECTRODE) ×2 IMPLANT
ELECT REM PT RETURN 9FT ADLT (ELECTROSURGICAL) ×2
ELECTRODE REM PT RTRN 9FT ADLT (ELECTROSURGICAL) ×1 IMPLANT
GLOVE BIO SURGEON STRL SZ7 (GLOVE) ×6 IMPLANT
GLOVE BIOGEL M 7.0 STRL (GLOVE) ×2 IMPLANT
GLOVE BIOGEL PI IND STRL 7.5 (GLOVE) ×1 IMPLANT
GLOVE BIOGEL PI INDICATOR 7.5 (GLOVE) ×1
GOWN STRL REUS W/ TWL LRG LVL3 (GOWN DISPOSABLE) ×2 IMPLANT
GOWN STRL REUS W/TWL LRG LVL3 (GOWN DISPOSABLE) ×2
KIT MARKER MARGIN INK (KITS) ×2 IMPLANT
NEEDLE HYPO 25X1 1.5 SAFETY (NEEDLE) ×2 IMPLANT
NS IRRIG 1000ML POUR BTL (IV SOLUTION) ×2 IMPLANT
PACK BASIN DAY SURGERY FS (CUSTOM PROCEDURE TRAY) ×2 IMPLANT
PENCIL BUTTON HOLSTER BLD 10FT (ELECTRODE) ×2 IMPLANT
SLEEVE SCD COMPRESS KNEE MED (MISCELLANEOUS) ×2 IMPLANT
SPONGE LAP 4X18 X RAY DECT (DISPOSABLE) ×2 IMPLANT
STRIP CLOSURE SKIN 1/2X4 (GAUZE/BANDAGES/DRESSINGS) ×2 IMPLANT
SUT MNCRL AB 4-0 PS2 18 (SUTURE) ×2 IMPLANT
SUT VIC AB 2-0 SH 27 (SUTURE) ×1
SUT VIC AB 2-0 SH 27XBRD (SUTURE) ×1 IMPLANT
SUT VIC AB 3-0 SH 27 (SUTURE) ×1
SUT VIC AB 3-0 SH 27X BRD (SUTURE) ×1 IMPLANT
SYR CONTROL 10ML LL (SYRINGE) ×2 IMPLANT
TOWEL OR 17X24 6PK STRL BLUE (TOWEL DISPOSABLE) ×2 IMPLANT
TOWEL OR NON WOVEN STRL DISP B (DISPOSABLE) ×2 IMPLANT

## 2016-12-18 NOTE — Discharge Instructions (Signed)
[facility] °Office Phone Number [phone] ° °POST OP INSTRUCTIONS ° °Always review your discharge instruction sheet given to you by the facility where your surgery was performed. ° °IF YOU HAVE DISABILITY OR FAMILY LEAVE FORMS, YOU MUST BRING THEM TO THE OFFICE FOR PROCESSING.  DO NOT GIVE THEM TO YOUR DOCTOR. ° °1. A prescription for pain medication may be given to you upon discharge.  Take your pain medication as prescribed, if needed.  If narcotic pain medicine is not needed, then you may take acetaminophen (Tylenol), naprosyn (Alleve) or ibuprofen (Advil) as needed. °2. Take your usually prescribed medications unless otherwise directed °3. If you need a refill on your pain medication, please contact your pharmacy.  They will contact our office to request authorization.  Prescriptions will not be filled after 5pm or on week-ends. °4. You should eat very light the first 24 hours after surgery, such as soup, crackers, pudding, etc.  Resume your normal diet the day after surgery. °5. Most patients will experience some swelling and bruising in the breast.  Ice packs and a good support bra will help.  Wear the breast binder provided or a sports bra for 72 hours day and night.  After that wear a sports bra during the day until you return to the office. Swelling and bruising can take several days to resolve.  °6. It is common to experience some constipation if taking pain medication after surgery.  Increasing fluid intake and taking a stool softener will usually help or prevent this problem from occurring.  A mild laxative (Milk of Magnesia or Miralax) should be taken according to package directions if there are no bowel movements after 48 hours. °7. Unless discharge instructions indicate otherwise, you may remove your bandages 48 hours after surgery and you may shower at that time.  You may have steri-strips (small skin tapes) in place directly over the incision.  These strips should be left on the  skin for 7-10 days and will come off on their own.  If your surgeon used skin glue on the incision, you may shower in 24 hours.  The glue will flake off over the next 2-3 weeks.  Any sutures or staples will be removed at the office during your follow-up visit. °8. ACTIVITIES:  You may resume regular daily activities (gradually increasing) beginning the next day.  Wearing a good support bra or sports bra minimizes pain and swelling.  You may have sexual intercourse when it is comfortable. °a. You may drive when you no longer are taking prescription pain medication, you can comfortably wear a seatbelt, and you can safely maneuver your car and apply brakes. °b. RETURN TO WORK:  ______________________________________________________________________________________ °9. You should see your doctor in the office for a follow-up appointment approximately two weeks after your surgery.  Your doctor’s nurse will typically make your follow-up appointment when she calls you with your pathology report.  Expect your pathology report 3-4 business days after your surgery.  You may call to check if you do not hear from us after three days. °10. OTHER INSTRUCTIONS: _______________________________________________________________________________________________ _____________________________________________________________________________________________________________________________________ °_____________________________________________________________________________________________________________________________________ °_____________________________________________________________________________________________________________________________________ ° °WHEN TO CALL DR WAKEFIELD: °1. Fever over 101.0 °2. Nausea and/or vomiting. °3. Extreme swelling or bruising. °4. Continued bleeding from incision. °5. Increased pain, redness, or drainage from the incision. ° °The clinic staff is available to answer your questions during regular  business hours.  Please don’t hesitate to call and ask to speak to one of the nurses for clinical concerns.  If   you have a medical emergency, go to the nearest emergency room or call 911.  A surgeon from [facility] is always on call at the hospital. ° °For further questions, please visit [email or web address] ° ° ° ° ° °Post Anesthesia Home Care Instructions ° °Activity: °Get plenty of rest for the remainder of the day. A responsible individual must stay with you for 24 hours following the procedure.  °For the next 24 hours, DO NOT: °-Drive a car °-Operate machinery °-Drink alcoholic beverages °-Take any medication unless instructed by your physician °-Make any legal decisions or sign important papers. ° °Meals: °Start with liquid foods such as gelatin or soup. Progress to regular foods as tolerated. Avoid greasy, spicy, heavy foods. If nausea and/or vomiting occur, drink only clear liquids until the nausea and/or vomiting subsides. Call your physician if vomiting continues. ° °Special Instructions/Symptoms: °Your throat may feel dry or sore from the anesthesia or the breathing tube placed in your throat during surgery. If this causes discomfort, gargle with warm salt water. The discomfort should disappear within 24 hours. ° °If you had a scopolamine patch placed behind your ear for the management of post- operative nausea and/or vomiting: ° °1. The medication in the patch is effective for 72 hours, after which it should be removed.  Wrap patch in a tissue and discard in the trash. Wash hands thoroughly with soap and water. °2. You may remove the patch earlier than 72 hours if you experience unpleasant side effects which may include dry mouth, dizziness or visual disturbances. °3. Avoid touching the patch. Wash your hands with soap and water after contact with the patch. °  ° °

## 2016-12-18 NOTE — Anesthesia Preprocedure Evaluation (Signed)
Anesthesia Evaluation  Patient identified by MRN, date of birth, ID band Patient awake    Reviewed: Allergy & Precautions, NPO status , Patient's Chart, lab work & pertinent test results  History of Anesthesia Complications Negative for: history of anesthetic complications  Airway Mallampati: II  TM Distance: >3 FB Neck ROM: Full    Dental  (+) Teeth Intact   Pulmonary neg shortness of breath, neg COPD, neg recent URI, Current Smoker,    breath sounds clear to auscultation       Cardiovascular negative cardio ROS   Rhythm:Regular     Neuro/Psych negative neurological ROS  negative psych ROS   GI/Hepatic negative GI ROS, Neg liver ROS,   Endo/Other  negative endocrine ROS  Renal/GU negative Renal ROS     Musculoskeletal  (+) Arthritis ,   Abdominal   Peds  Hematology negative hematology ROS (+)   Anesthesia Other Findings   Reproductive/Obstetrics                             Anesthesia Physical Anesthesia Plan  ASA: II  Anesthesia Plan: General   Post-op Pain Management:    Induction: Intravenous  PONV Risk Score and Plan: 2 and Ondansetron and Dexamethasone  Airway Management Planned: LMA  Additional Equipment: None  Intra-op Plan:   Post-operative Plan: Extubation in OR  Informed Consent: I have reviewed the patients History and Physical, chart, labs and discussed the procedure including the risks, benefits and alternatives for the proposed anesthesia with the patient or authorized representative who has indicated his/her understanding and acceptance.   Dental advisory given  Plan Discussed with: CRNA and Surgeon  Anesthesia Plan Comments:         Anesthesia Quick Evaluation

## 2016-12-18 NOTE — Progress Notes (Signed)
Patient ID: Kristin Brennan, female   DOB: 27-Sep-1950, 66 y.o.   MRN: [id number] Jordan reviewed day of surgery

## 2016-12-18 NOTE — H&P (Signed)
66 yof with abnl right breast calcs. she has no real prior breast history. she does have multiple family members with breast cancer including her mom who had scalp met from likely breast cancer. she underwent screening mm in [facility] where she was found to have b density breasts. she has 3x5x7 mm of calcifications in the right breast uoq. US shows an oval hypoechoic mass measuring 3x5x5 mm likely correlating to calcs. US of the axilla is normal. she has undergone stereo biopsy and this is int grade dcis,er/pr negative. she declined genetic testing at that time. I treated her with abx for left breast cellulitis from insect bites last time and this is better.   Past Surgical History Colon Polyp Removal - Colonoscopy   Diagnostic Studies History Colonoscopy  5-10 years ago Mammogram  within last year Pap Smear  1-5 years ago  Allergies Codeine/Codeine Derivatives  Nausea. Allergies Reconciled   Medication History  No Current Medications Medications Reconciled  Social History  Alcohol use  Moderate alcohol use. Caffeine use  Carbonated beverages, Coffee, Tea. No drug use  Tobacco use  Current every day smoker.  Family History  Breast Cancer  Mother. Cervical Cancer  Mother. Colon Polyps  Sister. Hypertension  Sister. Kidney Disease  Mother. Prostate Cancer  Father. Respiratory Condition  Father.  Pregnancy / Birth History Age at menarche  66 years. Age of menopause  39-60 Gravida  2 Maternal age  38-20 Para  1   Review of Systems  General Not Present- Appetite Loss, Chills, Fatigue, Fever, Night Sweats, Weight Gain and Weight Loss. Skin Present- Dryness. Not Present- Change in Wart/Mole, Hives, Jaundice, New Lesions, Non-Healing Wounds, Rash and Ulcer. HEENT Present- Wears glasses/contact lenses. Not Present- Earache, Hearing Loss, Hoarseness, Nose Bleed, Oral Ulcers, Ringing in the Ears, Seasonal Allergies, Sinus Pain, Sore Throat, Visual  Disturbances and Yellow Eyes. Respiratory Present- Chronic Cough. Not Present- Bloody sputum, Difficulty Breathing, Snoring and Wheezing. Cardiovascular Not Present- Chest Pain, Difficulty Breathing Lying Down, Leg Cramps, Palpitations, Rapid Heart Rate, Shortness of Breath and Swelling of Extremities. Female Genitourinary Present- Urgency. Not Present- Frequency, Nocturia, Painful Urination and Pelvic Pain. Musculoskeletal Not Present- Back Pain, Joint Pain, Joint Stiffness, Muscle Pain, Muscle Weakness and Swelling of Extremities. Psychiatric Not Present- Anxiety, Bipolar, Change in Sleep Pattern, Depression, Fearful and Frequent crying. Endocrine Not Present- Cold Intolerance, Excessive Hunger, Hair Changes, Heat Intolerance, Hot flashes and New Diabetes.  Vitals  Weight: 154.6 lb Height: 65in Body Surface Area: 1.77 m Body Mass Index: 25.73 kg/m  Temp.: 98.21F  Pulse: 80 (Regular)  BP: 118/82 (Sitting, Left Arm, Standard) Physical Exam General Mental Status-Alert. Orientation-Oriented X3. Chest and Lung Exam Chest and lung exam reveals -quiet, even and easy respiratory effort with no use of accessory muscles and on auscultation, normal breath sounds, no adventitious sounds and normal vocal resonance. Breast Nipples-No Discharge. Breast Lump-No Palpable Breast Mass. Cardiovascular Cardiovascular examination reveals -normal heart sounds, regular rate and rhythm with no murmurs. Lymphatic Head & Neck General Head & Neck Lymphatics: Bilateral - Description - Normal. Axillary General Axillary Region: Bilateral - Description - Normal. Note: no Pratt adenopathy     Assessment & Plan DUCTAL CARCINOMA IN SITU (DCIS) OF RIGHT BREAST (D05.11) Story: Right breast seed guided lumpectomy rad onc referral I discussed at length she does not need node biopsy. we discussed staging and pathophysiology of breast cancer . We discussed the options for treatment of the  breast cancer which included lumpectomy versus a mastectomy. We  discussed the performance of the lumpectomy with radioactive seed placement. We discussed a 5-10% chance of a positive margin requiring reexcision in the operating room. We also discussed that she will need radiation therapy if she undergoes lumpectomy. We discussed the mastectomy (removal of whole breast) and the postoperative care for that as well. Mastectomy can be followed by reconstruction. The decision for lumpectomy vs mastectomy has no impact on decision for chemotherapy. Most mastectomy patients will not need radiation therapy. We discussed that there is no difference in her survival whether she undergoes lumpectomy with radiation therapy or antiestrogen therapy versus a mastectomy. There is also no real difference between her recurrence in the breast. We discussed the risks of operation including bleeding, infection, possible reoperation. She understands her further therapy will be based on what her stages at the time of her operation.

## 2016-12-18 NOTE — Interval H&P Note (Signed)
History and Physical Interval Note:  12/18/2016 9:11 AM  Kristin Brennan  has presented today for surgery, with the diagnosis of RIGHT BREAST CANCER  The various methods of treatment have been discussed with the patient and family. After consideration of risks, benefits and other options for treatment, the patient has consented to  Procedure(s): BREAST LUMPECTOMY WITH RADIOACTIVE SEED LOCALIZATION (Right) as a surgical intervention .  The patient's history has been reviewed, patient examined, no change in status, stable for surgery.  I have reviewed the patient's chart and labs.  Questions were answered to the patient's satisfaction.     Rabab Currington

## 2016-12-18 NOTE — Op Note (Signed)
Preoperative diagnoses: Right breast dcis Postoperative diagnosis: Same as above Procedure: Right breast seed guided lumpectomy Surgeon: Dr. Serita Grammes Anesthesia: Gen. Estimated blood loss: minimal Complications: None Drains: None Specimens: Right breast tissue marked with paint, additional medial margin marked short superior, long lateral double deep Sponge and needle count correct at completion Disposition to recovery stable  Indications: This is a 30 yof with a small area of calcifications on mm and a mass on [place]. This underwent biopsy and is dcis.  We discussed options and have elected to proceed with lumpectomy. A radioactive seed was placed prior to surgery and I had these mm in the OR.   Procedure: After informed consent was obtained she was then taken to the operating room. She was given cefazolin. Sequential compression devices were on her legs. She was placed under general anesthesia without complication. Her chestwas then prepped and draped in the standard sterile surgical fashion. A surgical timeout was then performed. The seed was in the uoq and appeared close to skin with counts. I elected to make a curvilinear incision overlying the seed in the uoq after infiltration with marcaine.  I then used the neoprobe to guide excision of the seed and surrounding tissue with attempt to get a clear margin.  Once I had done this the seed was removed separately as it was in a little hematoma cavity.  Mammogram confirmed removal of seed. Mammogram also confirmed removal of the clip.  I thought due to location of seed I might be close to medial margin so I did excised more in this area and marked as above.  This was then sent to pathology. Hemostasis was observed. Clips were placed in the cavity. I closed the breast tissue with a 2-0 Vicryl. The dermis was closed with 3-0 Vicryl and the skin with 4-0 Monocryl.Dermabond and steristrips were placed on the incision. A breast binder was placed.  She was transferred to recovery stable

## 2016-12-18 NOTE — Anesthesia Postprocedure Evaluation (Signed)
Anesthesia Post Note  Patient: Kristin Brennan  Procedure(s) Performed: Procedure(s) (LRB): BREAST LUMPECTOMY WITH RADIOACTIVE SEED LOCALIZATION (Right)     Patient location during evaluation: PACU Anesthesia Type: General Level of consciousness: awake and alert Pain management: pain level controlled Vital Signs Assessment: post-procedure vital signs reviewed and stable Respiratory status: spontaneous breathing, nonlabored ventilation, respiratory function stable and patient connected to nasal cannula oxygen Cardiovascular status: blood pressure returned to baseline and stable Postop Assessment: no signs of nausea or vomiting Anesthetic complications: no    Last Vitals:  Vitals:   12/18/16 1125 12/18/16 1233  BP:  (!) 143/68  Pulse: 63 65  Resp: 18   Temp:  36.7 C    Last Pain:  Vitals:   12/18/16 1233  TempSrc: Oral  PainSc: 0-No pain                 Waynetta Metheny

## 2016-12-18 NOTE — Transfer of Care (Signed)
Immediate Anesthesia Transfer of Care Note  Patient: Kristin Brennan  Procedure(s) Performed: Procedure(s): BREAST LUMPECTOMY WITH RADIOACTIVE SEED LOCALIZATION (Right)  Patient Location: PACU  Anesthesia Type:General  Level of Consciousness: awake, alert , oriented and patient cooperative  Airway & Oxygen Therapy: Patient Spontanous Breathing and Patient connected to face mask oxygen  Post-op Assessment: Report given to RN and Post -op Vital signs reviewed and stable  Post vital signs: Reviewed and stable  Last Vitals:  Vitals:   12/18/16 0836  BP: (!) 153/69  Pulse: (!) 53  Resp: 20  Temp: 36.4 C    Last Pain:  Vitals:   12/18/16 0836  TempSrc: Oral         Complications: No apparent anesthesia complications

## 2016-12-18 NOTE — Anesthesia Procedure Notes (Signed)
Procedure Name: LMA Insertion Date/Time: 12/18/2016 9:30 AM Performed by: Iaan Oregel D Pre-anesthesia Checklist: Patient identified, Emergency Drugs available, Suction available and Patient being monitored Patient Re-evaluated:Patient Re-evaluated prior to induction Oxygen Delivery Method: Circle system utilized Preoxygenation: Pre-oxygenation with 100% oxygen Induction Type: IV induction Ventilation: Mask ventilation without difficulty LMA: LMA inserted LMA Size: 3.0 Number of attempts: 1 Airway Equipment and Method: Bite block Placement Confirmation: positive ETCO2 Tube secured with: Tape Dental Injury: Teeth and Oropharynx as per pre-operative assessment

## 2016-12-19 ENCOUNTER — Encounter (HOSPITAL_BASED_OUTPATIENT_CLINIC_OR_DEPARTMENT_OTHER): Payer: Self-pay | Admitting: General Surgery

## 2016-12-26 ENCOUNTER — Encounter: Payer: Self-pay | Admitting: Hematology and Oncology

## 2016-12-26 ENCOUNTER — Encounter: Payer: Self-pay | Admitting: *Deleted

## 2016-12-26 ENCOUNTER — Ambulatory Visit (HOSPITAL_BASED_OUTPATIENT_CLINIC_OR_DEPARTMENT_OTHER): Payer: Medicare HMO | Admitting: Hematology and Oncology

## 2016-12-26 DIAGNOSIS — D0511 Intraductal carcinoma in situ of right breast: Secondary | ICD-10-CM

## 2016-12-26 DIAGNOSIS — D0501 Lobular carcinoma in situ of right breast: Secondary | ICD-10-CM

## 2016-12-26 NOTE — Progress Notes (Addendum)
Patient Care Team: Deland Pretty, MD as PCP - General (Internal Medicine)  DIAGNOSIS:  Encounter Diagnosis  Name Primary?  . Ductal carcinoma in situ (DCIS) of right breast     SUMMARY OF ONCOLOGIC HISTORY:   Ductal carcinoma in situ (DCIS) of right breast   10/24/2016 Initial Diagnosis    Screening mammogram at [facility] revealed 3 x 5 x 7 mm calcifications upper outer quadrant right breast;  stereotactic biopsy revealed DCIS with calcifications, intermediate grade, ER 0%, PR 0%, Tis Nx stage 0      12/18/2016 Surgery    Right lumpectomy: LCIS with calcifications and focal pleomorphic features, margins negative, right additional medial margin ALH       CHIEF COMPLIANT: Follow-up after recent lumpectomy  INTERVAL HISTORY: Kristin Brennan is a 66 year old with above-mentioned history of DCIS right breast underwent lumpectomy and is here today to discuss the pathology report. She feels soreness in the breast but denies any pain. Final pathology revealed pleomorphic LCIS. She is here today to discuss the pros and cons of adjuvant antiestrogen therapy.  REVIEW OF SYSTEMS:   Constitutional: Denies fevers, chills or abnormal weight loss Eyes: Denies blurriness of vision Ears, nose, mouth, throat, and face: Denies mucositis or sore throat Respiratory: Denies cough, dyspnea or wheezes Cardiovascular: Denies palpitation, chest discomfort Gastrointestinal:  Denies nausea, heartburn or change in bowel habits Skin: Denies abnormal skin rashes Lymphatics: Denies new lymphadenopathy or easy bruising Neurological:Denies numbness, tingling or new weaknesses Behavioral/Psych: Mood is stable, no new changes  Extremities: No lower extremity edema Breast: Recent right lumpectomy All other systems were reviewed with the patient and are negative.  I have reviewed the past medical history, past surgical history, social history and family history with the patient and they are unchanged  from previous note.  ALLERGIES:  is allergic to codeine.  MEDICATIONS:  Current Outpatient Prescriptions  Medication Sig Dispense Refill  . traMADol (ULTRAM) 50 MG tablet Take 1 tablet (50 mg total) by mouth every 6 (six) hours as needed. 12 tablet 0   No current facility-administered medications for this visit.     PHYSICAL EXAMINATION: ECOG PERFORMANCE STATUS: 1 - Symptomatic but completely ambulatory  Vitals:   12/26/16 1058  BP: (!) 148/89  Pulse: 65  Resp: 17  Temp: 98.2 F (36.8 C)   Filed Weights   12/26/16 1058  Weight: 154 lb 3.2 oz (69.9 kg)    GENERAL:alert, no distress and comfortable SKIN: skin color, texture, turgor are normal, no rashes or significant lesions EYES: normal, Conjunctiva are pink and non-injected, sclera clear OROPHARYNX:no exudate, no erythema and lips, buccal mucosa, and tongue normal  NECK: supple, thyroid normal size, non-tender, without nodularity LYMPH:  no palpable lymphadenopathy in the cervical, axillary or inguinal LUNGS: clear to auscultation and percussion with normal breathing effort HEART: regular rate & rhythm and no murmurs and no lower extremity edema ABDOMEN:abdomen soft, non-tender and normal bowel sounds MUSCULOSKELETAL:no cyanosis of digits and no clubbing  NEURO: alert & oriented x 3 with fluent speech, no focal motor/sensory deficits EXTREMITIES: No lower extremity edema  LABORATORY DATA:  I have reviewed the data as listed   Chemistry   No results found for: NA, K, CL, CO2, BUN, CREATININE, GLU No results found for: CALCIUM, ALKPHOS, AST, ALT, BILITOT     Lab Results  Component Value Date   WBC 8.0 07/26/2009   HGB 14.3 07/26/2009   HCT 42.0 07/26/2009   MCV 94.9 07/26/2009   PLT 262  07/26/2009    ASSESSMENT & PLAN:  Ductal carcinoma in situ (DCIS) of right breast 10/23/2016: Screening mammogram at [facility] revealed 3 x 5 x 7 mm calcifications upper outer quadrant right breast;  stereotactic biopsy  revealed DCIS with calcifications, intermediate grade, ER 0%, PR 0%, Tis Nx stage 0  12/18/2016: Right lumpectomy: LCIS with calcifications and focal pleomorphic features, margins negative, right additional medial margin ALH.  Pathology review: I discussed the final pathology report which did not see any further evidence of DCIS. I discussed the difference between LCIS and DCIS.  Recommendation: Adjuvant radiation therapy. I discussed with the patient that even though the initial diagnosis was DCIS which was ER/PR negative, having LCIS does increase her risk of breast cancer. Based upon NSABP P1 clinical trial, the risk is about 1% per year of life expectancy. So potentially she may have a 20% risk of breast cancer. Using tamoxifen would reduce the risk in half.  Tamoxifen counseling:We discussed the risks and benefits of tamoxifen. These include but not limited to insomnia, hot flashes, mood changes, vaginal dryness, and weight gain. Although rare, serious side effects including endometrial cancer, risk of blood clots were also discussed. We strongly believe that the benefits far outweigh the risks.  Planned treatment duration is 5 years.  Patient was provided with literature and she will think about it and make a decision. I will see her back in 3 months for follow-up. I spent 25 minutes talking to the patient of which more than half was spent in counseling and coordination of care.  No orders of the defined types were placed in this encounter.  The patient has a good understanding of the overall plan. she agrees with it. she will call with any problems that may develop before the next visit here.   Rulon Eisenmenger, MD 12/26/16  Addendum: After discussing with Dr. Donne Hazel and pathology, it appears that the initial biopsy was really reviewed. It is now changed to pleomorphic LCIS. Based on this finding alone, she does not need adjuvant radiation. We discussed this with Dr. Pablo Ledger who  will see the patient and discuss her recommendation.

## 2016-12-26 NOTE — Assessment & Plan Note (Signed)
10/23/2016: Screening mammogram at [facility] revealed 3 x 5 x 7 mm calcifications upper outer quadrant right breast;  stereotactic biopsy revealed DCIS with calcifications, intermediate grade, ER 0%, PR 0%, Tis Nx stage 0  12/18/2016: Right lumpectomy: LCIS with calcifications and focal pleomorphic features, margins negative, right additional medial margin ALH.  Pathology review: I discussed the final pathology report which did not see any further evidence of DCIS. I discussed the difference between LCIS and DCIS.  Recommendation: Adjuvant radiation therapy. I did not recommend antiestrogen therapy based on the initial biopsy showing ER/PR negative DCIS.

## 2016-12-28 ENCOUNTER — Telehealth: Payer: Self-pay | Admitting: *Deleted

## 2016-12-28 NOTE — Telephone Encounter (Signed)
Spoke with patient and informed her of the follow up appointment with Dr. Pablo Ledger for 01/11/17 at 1030am.

## 2017-01-15 ENCOUNTER — Encounter: Payer: Self-pay | Admitting: *Deleted

## 2017-02-15 DIAGNOSIS — R69 Illness, unspecified: Secondary | ICD-10-CM | POA: Diagnosis not present

## 2017-03-23 ENCOUNTER — Encounter: Payer: Medicare HMO | Admitting: Adult Health

## 2017-03-30 DIAGNOSIS — L570 Actinic keratosis: Secondary | ICD-10-CM | POA: Diagnosis not present

## 2017-03-30 DIAGNOSIS — Z85828 Personal history of other malignant neoplasm of skin: Secondary | ICD-10-CM | POA: Diagnosis not present

## 2017-03-30 DIAGNOSIS — L812 Freckles: Secondary | ICD-10-CM | POA: Diagnosis not present

## 2017-03-30 DIAGNOSIS — L821 Other seborrheic keratosis: Secondary | ICD-10-CM | POA: Diagnosis not present

## 2017-04-02 ENCOUNTER — Telehealth: Payer: Self-pay | Admitting: Hematology and Oncology

## 2017-04-02 ENCOUNTER — Ambulatory Visit (HOSPITAL_BASED_OUTPATIENT_CLINIC_OR_DEPARTMENT_OTHER): Payer: Medicare HMO | Admitting: Hematology and Oncology

## 2017-04-02 DIAGNOSIS — Z1231 Encounter for screening mammogram for malignant neoplasm of breast: Secondary | ICD-10-CM

## 2017-04-02 DIAGNOSIS — D0501 Lobular carcinoma in situ of right breast: Secondary | ICD-10-CM | POA: Diagnosis not present

## 2017-04-02 DIAGNOSIS — D0511 Intraductal carcinoma in situ of right breast: Secondary | ICD-10-CM

## 2017-04-02 DIAGNOSIS — Z171 Estrogen receptor negative status [ER-]: Secondary | ICD-10-CM | POA: Diagnosis not present

## 2017-04-02 NOTE — Progress Notes (Signed)
Patient Care Team: Deland Pretty, MD as PCP - General (Internal Medicine) Nicholas Lose, MD as Consulting Physician (Hematology and Oncology) Delice Bison Charlestine Massed, NP as Nurse Practitioner (Hematology and Oncology) Rolm Bookbinder, MD as Consulting Physician (General Surgery)  DIAGNOSIS:  Encounter Diagnoses  Name Primary?  . Ductal carcinoma in situ (DCIS) of right breast   . 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  Because of this she did not need radiation and chemotherapy.       CHIEF COMPLIANT: Follow-up of pleomorphic LCIS  INTERVAL HISTORY: Kristin Brennan is a 66 year old who originally had a diagnosis of DCIS but it was later changed to pleomorphic LCIS.  Because of this she did not need adjuvant radiation therapy.  She is here today to discuss whether there is any role of adjuvant antiestrogen therapy.  Before the pleomorphic LCIS was determined, ER PR was performed on the DCIS presumptive diagnosis and it was found to be negative.  REVIEW OF SYSTEMS:   Constitutional: Denies fevers, chills or abnormal weight loss Eyes: Denies blurriness of vision Ears, nose, mouth, throat, and face: Denies mucositis or sore throat Respiratory: Denies cough, dyspnea or wheezes Cardiovascular: Denies palpitation, chest discomfort Gastrointestinal:  Denies nausea, heartburn or change in bowel  habits Skin: Denies abnormal skin rashes Lymphatics: Denies new lymphadenopathy or easy bruising Neurological:Denies numbness, tingling or new weaknesses Behavioral/Psych: Mood is stable, no new changes  Extremities: No lower extremity edema All other systems were reviewed with the patient and are negative.  I have reviewed the past medical history, past surgical history, social history and family history with the patient and they are unchanged from previous note.  ALLERGIES:  is allergic to codeine.  MEDICATIONS:  Current Outpatient Medications  Medication Sig Dispense Refill  . traMADol (ULTRAM) 50 MG tablet Take 1 tablet (50 mg total) by mouth every 6 (six) hours as needed. 12 tablet 0   No current facility-administered medications for this visit.     PHYSICAL EXAMINATION: ECOG PERFORMANCE STATUS: 1 - Symptomatic but completely ambulatory  Vitals:   04/02/17 1040  BP: (!) 163/85  Pulse: 67  Resp: 20  Temp: 98.1 F (36.7 C)  SpO2: 100%   Filed Weights   04/02/17 1040  Weight: 156 lb 4.8 oz (70.9 kg)    GENERAL:alert, no distress and comfortable SKIN: skin color, texture, turgor are normal, no rashes or significant lesions EYES: normal, Conjunctiva are pink and non-injected, sclera clear OROPHARYNX:no exudate, no erythema and lips, buccal mucosa, and tongue normal  NECK: supple, thyroid normal size, non-tender, without nodularity LYMPH:  no palpable lymphadenopathy in the cervical, axillary or inguinal LUNGS: clear to auscultation and percussion with normal breathing effort HEART: regular rate & rhythm and no murmurs and no lower extremity edema ABDOMEN:abdomen soft, non-tender and normal bowel sounds MUSCULOSKELETAL:no cyanosis of digits and no clubbing  NEURO: alert & oriented  x 3 with fluent speech, no focal motor/sensory deficits EXTREMITIES: No lower extremity edema  LABORATORY DATA:  I have reviewed the data as listed   Chemistry   No results found for: NA,  K, CL, CO2, BUN, CREATININE, GLU No results found for: CALCIUM, ALKPHOS, AST, ALT, BILITOT     Lab Results  Component Value Date   WBC 8.0 07/26/2009   HGB 14.3 07/26/2009   HCT 42.0 07/26/2009   MCV 94.9 07/26/2009   PLT 262 07/26/2009    ASSESSMENT & PLAN:  Ductal carcinoma in situ (DCIS) of right breast 10/23/2016: Screening mammogram at [facility] revealed 3 x 5 x 7 mm calcifications upper outer quadrant right breast; stereotactic biopsy revealed DCIS with calcifications, intermediate grade, ER 0%, PR 0%, Tis Nx stage 0  12/18/2016: Right lumpectomy: LCIS with calcifications and focal pleomorphic features, margins negative, right additional medial margin ALH.  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 When we calculated the patient's lifetime risk of breast cancer it was coming up to be 25%.  Because of this we will perform breast MRI every other year.  First breast MRI will be done in October 2019.  First mammogram will be due April 2019. Return to clinic in October for follow-up  I spent 25 minutes talking to the patient of which more than half was spent in counseling and coordination of care.  Orders Placed This Encounter  Procedures  . MR BREAST BILATERAL W WO CONTRAST    Standing Status:   Future    Standing Expiration Date:   06/02/2018    Order Specific Question:   If indicated for the ordered procedure, I authorize the administration of contrast media per Radiology protocol    Answer:   Yes    Order Specific Question:   What is the patient's sedation requirement?    Answer:   No Sedation    Order Specific Question:   Does the patient have a pacemaker or implanted devices?    Answer:   No    Order Specific Question:   Radiology Contrast Protocol - do NOT remove file path    Answer:    \\[email or web address]    Order Specific Question:   Reason for Exam additional comments    Answer:   >20% lifetime risk of breast cancer    Order Specific Question:   Preferred imaging location?    Answer:   [facility] (table limit-550lbs)  . MM DIAG BREAST TOMO BILATERAL    [facility] [place], HAD BX IN June 2018/NO CURRENT PROBLEMS, NO IMPLANTS/NO NEEDS/TA/[facility]ISON W/EPIC ORDER    Standing Status:   Future    Standing Expiration Date:   04/02/2018    Order Specific Question:   Reason for Exam (SYMPTOM  OR DIAGNOSIS REQUIRED)    Answer:   Annual mammogram with history of pleomorphic LCIS    Order Specific Question:   Preferred imaging location?    Answer:   [facility]   The patient has a good understanding of the overall plan. she agrees with it. she will call with any problems that may develop before the next visit here.   Rulon Eisenmenger, MD 04/02/17

## 2017-04-02 NOTE — Telephone Encounter (Signed)
Spoke with patient and scheduled appt per 11/12 los. Spoke with [facility] and scheduled mammo and MRI.

## 2017-04-02 NOTE — Assessment & Plan Note (Signed)
10/23/2016: Screening mammogram at [facility] revealed 3 x 5 x 7 mm calcifications upper outer quadrant right breast; stereotactic biopsy revealed DCIS with calcifications, intermediate grade, ER 0%, PR 0%, Tis Nx stage 0  12/18/2016: Right lumpectomy: LCIS with calcifications and focal pleomorphic features, margins negative, right additional medial margin ALH.  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  Treatment plan: Tamoxifen 20 mg daily  Return to clinic in 3 months for follow-up

## 2017-06-13 DIAGNOSIS — R69 Illness, unspecified: Secondary | ICD-10-CM | POA: Diagnosis not present

## 2017-07-03 DIAGNOSIS — H5213 Myopia, bilateral: Secondary | ICD-10-CM | POA: Diagnosis not present

## 2017-07-03 DIAGNOSIS — H2513 Age-related nuclear cataract, bilateral: Secondary | ICD-10-CM | POA: Diagnosis not present

## 2017-08-22 DIAGNOSIS — R809 Proteinuria, unspecified: Secondary | ICD-10-CM | POA: Diagnosis not present

## 2017-08-22 DIAGNOSIS — R5383 Other fatigue: Secondary | ICD-10-CM | POA: Diagnosis not present

## 2017-08-22 DIAGNOSIS — Z13220 Encounter for screening for lipoid disorders: Secondary | ICD-10-CM | POA: Diagnosis not present

## 2017-08-22 DIAGNOSIS — I10 Essential (primary) hypertension: Secondary | ICD-10-CM | POA: Diagnosis not present

## 2017-08-27 DIAGNOSIS — B351 Tinea unguium: Secondary | ICD-10-CM | POA: Diagnosis not present

## 2017-08-27 DIAGNOSIS — Z0000 Encounter for general adult medical examination without abnormal findings: Secondary | ICD-10-CM | POA: Diagnosis not present

## 2017-08-27 DIAGNOSIS — I10 Essential (primary) hypertension: Secondary | ICD-10-CM | POA: Diagnosis not present

## 2017-08-27 DIAGNOSIS — Z23 Encounter for immunization: Secondary | ICD-10-CM | POA: Diagnosis not present

## 2017-08-27 DIAGNOSIS — R69 Illness, unspecified: Secondary | ICD-10-CM | POA: Diagnosis not present

## 2017-08-27 DIAGNOSIS — R809 Proteinuria, unspecified: Secondary | ICD-10-CM | POA: Diagnosis not present

## 2017-09-05 ENCOUNTER — Ambulatory Visit: Payer: Medicare HMO | Admitting: Podiatry

## 2017-09-05 DIAGNOSIS — B351 Tinea unguium: Secondary | ICD-10-CM

## 2017-09-11 NOTE — Progress Notes (Signed)
   Subjective: 67 year old female presenting today as a new patient with a chief complaint of thickening and discoloration of bilateral great toenails. She also reports a wart to the medial aspect of the right fifth toe. She has not done anything to treat her symptoms. There are no modifying factors noted. Patient is here for further evaluation and treatment.   Past Medical History:  Diagnosis Date  . Arthritis    fingers  . Cancer ([place]) 11/2016   right breast DCIS  . Smoker    1/2-3/4 PPD    Objective: Physical Exam General: The patient is alert and oriented x3 in no acute distress.  Dermatology: Hyperkeratotic, discolored, thickened, onychodystrophy of nails noted bilaterally.  Skin is warm, dry and supple bilateral lower extremities. Negative for open lesions or macerations.  Vascular: Palpable pedal pulses bilaterally. No edema or erythema noted. Capillary refill within normal limits.  Neurological: Epicritic and protective threshold grossly intact bilaterally.   Musculoskeletal Exam: Range of motion within normal limits to all pedal and ankle joints bilateral. Muscle strength 5/5 in all groups bilateral.   Assessment: #1 onychomycosis bilateral toenails 1-5 #2 hyperkeratotic nails bilateral  Plan of Care:  #1 Patient was evaluated. #2 Appointment with Janett Billow, RN for laser fungal treatment.  #3 Declines oral Lamisil.  #4 Return to clinic as needed.    Edrick Kins, DPM [facility]  Dr. Edrick Kins, [place]                                        [place], [place] [address]                Office [phone]  Fax [phone]

## 2017-09-18 DIAGNOSIS — I10 Essential (primary) hypertension: Secondary | ICD-10-CM | POA: Diagnosis not present

## 2017-10-03 ENCOUNTER — Ambulatory Visit
Admission: RE | Admit: 2017-10-03 | Discharge: 2017-10-03 | Disposition: A | Discharge status: Home / Self Care | Payer: Medicare HMO | Source: Ambulatory Visit | Attending: Hematology and Oncology | Admitting: Hematology and Oncology

## 2017-10-03 DIAGNOSIS — D0511 Intraductal carcinoma in situ of right breast: Secondary | ICD-10-CM

## 2017-10-03 DIAGNOSIS — R928 Other abnormal and inconclusive findings on diagnostic imaging of breast: Secondary | ICD-10-CM | POA: Diagnosis not present

## 2017-10-08 DIAGNOSIS — M79672 Pain in left foot: Secondary | ICD-10-CM | POA: Diagnosis not present

## 2017-10-10 DIAGNOSIS — M79672 Pain in left foot: Secondary | ICD-10-CM | POA: Diagnosis not present

## 2017-10-10 DIAGNOSIS — I10 Essential (primary) hypertension: Secondary | ICD-10-CM | POA: Diagnosis not present

## 2017-10-10 DIAGNOSIS — M7732 Calcaneal spur, left foot: Secondary | ICD-10-CM | POA: Diagnosis not present

## 2017-10-17 DIAGNOSIS — N3281 Overactive bladder: Secondary | ICD-10-CM | POA: Diagnosis not present

## 2017-10-17 DIAGNOSIS — Z124 Encounter for screening for malignant neoplasm of cervix: Secondary | ICD-10-CM | POA: Diagnosis not present

## 2017-10-17 DIAGNOSIS — Z6825 Body mass index (BMI) 25.0-25.9, adult: Secondary | ICD-10-CM | POA: Diagnosis not present

## 2017-10-17 DIAGNOSIS — N3945 Continuous leakage: Secondary | ICD-10-CM | POA: Diagnosis not present

## 2017-10-19 DIAGNOSIS — T1511XA Foreign body in conjunctival sac, right eye, initial encounter: Secondary | ICD-10-CM | POA: Diagnosis not present

## 2017-10-31 DIAGNOSIS — I10 Essential (primary) hypertension: Secondary | ICD-10-CM | POA: Diagnosis not present

## 2017-11-27 DIAGNOSIS — R69 Illness, unspecified: Secondary | ICD-10-CM | POA: Diagnosis not present

## 2017-12-13 DIAGNOSIS — R69 Illness, unspecified: Secondary | ICD-10-CM | POA: Diagnosis not present

## 2017-12-25 DIAGNOSIS — K115 Sialolithiasis: Secondary | ICD-10-CM | POA: Diagnosis not present

## 2018-02-28 DIAGNOSIS — R69 Illness, unspecified: Secondary | ICD-10-CM | POA: Diagnosis not present

## 2018-03-26 DIAGNOSIS — L570 Actinic keratosis: Secondary | ICD-10-CM | POA: Diagnosis not present

## 2018-03-26 DIAGNOSIS — D1801 Hemangioma of skin and subcutaneous tissue: Secondary | ICD-10-CM | POA: Diagnosis not present

## 2018-03-26 DIAGNOSIS — L821 Other seborrheic keratosis: Secondary | ICD-10-CM | POA: Diagnosis not present

## 2018-03-26 DIAGNOSIS — L7211 Pilar cyst: Secondary | ICD-10-CM | POA: Diagnosis not present

## 2018-03-26 DIAGNOSIS — Z85828 Personal history of other malignant neoplasm of skin: Secondary | ICD-10-CM | POA: Diagnosis not present

## 2018-03-26 DIAGNOSIS — L812 Freckles: Secondary | ICD-10-CM | POA: Diagnosis not present

## 2018-05-06 ENCOUNTER — Other Ambulatory Visit: Payer: Self-pay | Admitting: General Surgery

## 2018-05-06 DIAGNOSIS — D0501 Lobular carcinoma in situ of right breast: Secondary | ICD-10-CM | POA: Diagnosis not present

## 2018-05-06 DIAGNOSIS — Z1231 Encounter for screening mammogram for malignant neoplasm of breast: Secondary | ICD-10-CM

## 2018-06-06 ENCOUNTER — Ambulatory Visit
Admission: RE | Admit: 2018-06-06 | Discharge: 2018-06-06 | Disposition: A | Discharge status: Home / Self Care | Payer: Medicare HMO | Source: Ambulatory Visit | Attending: General Surgery | Admitting: General Surgery

## 2018-06-06 DIAGNOSIS — Z1231 Encounter for screening mammogram for malignant neoplasm of breast: Secondary | ICD-10-CM

## 2018-06-06 DIAGNOSIS — Z803 Family history of malignant neoplasm of breast: Secondary | ICD-10-CM | POA: Diagnosis not present

## 2018-06-06 MED ORDER — GADOBENATE DIMEGLUMINE 529 MG/ML IV SOLN
7.0000 mL | Freq: Once | INTRAVENOUS | Status: AC | PRN
  Administered 2018-06-06: 7 mL via INTRAVENOUS

## 2018-06-13 DIAGNOSIS — R69 Illness, unspecified: Secondary | ICD-10-CM | POA: Diagnosis not present

## 2018-06-22 DIAGNOSIS — M25552 Pain in left hip: Secondary | ICD-10-CM | POA: Diagnosis not present

## 2018-06-26 DIAGNOSIS — M25552 Pain in left hip: Secondary | ICD-10-CM | POA: Diagnosis not present

## 2018-07-09 DIAGNOSIS — H5213 Myopia, bilateral: Secondary | ICD-10-CM | POA: Diagnosis not present

## 2018-07-09 DIAGNOSIS — H2513 Age-related nuclear cataract, bilateral: Secondary | ICD-10-CM | POA: Diagnosis not present

## 2018-07-09 DIAGNOSIS — H40013 Open angle with borderline findings, low risk, bilateral: Secondary | ICD-10-CM | POA: Diagnosis not present

## 2018-12-16 DIAGNOSIS — R69 Illness, unspecified: Secondary | ICD-10-CM | POA: Diagnosis not present

## 2019-01-02 ENCOUNTER — Other Ambulatory Visit: Payer: Self-pay | Admitting: Obstetrics and Gynecology

## 2019-01-02 DIAGNOSIS — Z9889 Other specified postprocedural states: Secondary | ICD-10-CM

## 2019-01-09 ENCOUNTER — Other Ambulatory Visit: Payer: Self-pay

## 2019-01-09 ENCOUNTER — Ambulatory Visit
Admission: RE | Admit: 2019-01-09 | Discharge: 2019-01-09 | Disposition: A | Discharge status: Home / Self Care | Payer: Medicare HMO | Source: Ambulatory Visit | Attending: Obstetrics and Gynecology | Admitting: Obstetrics and Gynecology

## 2019-01-09 DIAGNOSIS — R928 Other abnormal and inconclusive findings on diagnostic imaging of breast: Secondary | ICD-10-CM | POA: Diagnosis not present

## 2019-01-09 DIAGNOSIS — Z9889 Other specified postprocedural states: Secondary | ICD-10-CM

## 2019-01-21 DIAGNOSIS — Z6826 Body mass index (BMI) 26.0-26.9, adult: Secondary | ICD-10-CM | POA: Diagnosis not present

## 2019-01-21 DIAGNOSIS — N952 Postmenopausal atrophic vaginitis: Secondary | ICD-10-CM | POA: Diagnosis not present

## 2019-01-21 DIAGNOSIS — N819 Female genital prolapse, unspecified: Secondary | ICD-10-CM | POA: Diagnosis not present

## 2019-01-21 DIAGNOSIS — N3946 Mixed incontinence: Secondary | ICD-10-CM | POA: Diagnosis not present

## 2019-01-21 DIAGNOSIS — M8580 Other specified disorders of bone density and structure, unspecified site: Secondary | ICD-10-CM | POA: Diagnosis not present

## 2019-01-21 DIAGNOSIS — Z01419 Encounter for gynecological examination (general) (routine) without abnormal findings: Secondary | ICD-10-CM | POA: Diagnosis not present

## 2019-01-29 DIAGNOSIS — R69 Illness, unspecified: Secondary | ICD-10-CM | POA: Diagnosis not present

## 2019-02-26 DIAGNOSIS — N3946 Mixed incontinence: Secondary | ICD-10-CM | POA: Diagnosis not present

## 2019-02-26 DIAGNOSIS — N8189 Other female genital prolapse: Secondary | ICD-10-CM | POA: Diagnosis not present

## 2019-03-13 DIAGNOSIS — R69 Illness, unspecified: Secondary | ICD-10-CM | POA: Diagnosis not present

## 2019-03-13 DIAGNOSIS — I10 Essential (primary) hypertension: Secondary | ICD-10-CM | POA: Diagnosis not present

## 2019-03-25 DIAGNOSIS — Z0000 Encounter for general adult medical examination without abnormal findings: Secondary | ICD-10-CM | POA: Diagnosis not present

## 2019-03-25 DIAGNOSIS — I10 Essential (primary) hypertension: Secondary | ICD-10-CM | POA: Diagnosis not present

## 2019-03-25 DIAGNOSIS — R69 Illness, unspecified: Secondary | ICD-10-CM | POA: Diagnosis not present

## 2019-03-25 DIAGNOSIS — E7800 Pure hypercholesterolemia, unspecified: Secondary | ICD-10-CM | POA: Diagnosis not present

## 2019-03-27 DIAGNOSIS — L812 Freckles: Secondary | ICD-10-CM | POA: Diagnosis not present

## 2019-03-27 DIAGNOSIS — L570 Actinic keratosis: Secondary | ICD-10-CM | POA: Diagnosis not present

## 2019-03-27 DIAGNOSIS — L858 Other specified epidermal thickening: Secondary | ICD-10-CM | POA: Diagnosis not present

## 2019-03-27 DIAGNOSIS — L821 Other seborrheic keratosis: Secondary | ICD-10-CM | POA: Diagnosis not present

## 2019-03-27 DIAGNOSIS — Z85828 Personal history of other malignant neoplasm of skin: Secondary | ICD-10-CM | POA: Diagnosis not present

## 2019-06-19 DIAGNOSIS — R69 Illness, unspecified: Secondary | ICD-10-CM | POA: Diagnosis not present

## 2019-06-24 DIAGNOSIS — M8588 Other specified disorders of bone density and structure, other site: Secondary | ICD-10-CM | POA: Diagnosis not present

## 2019-07-12 ENCOUNTER — Ambulatory Visit: Payer: Medicare HMO | Attending: Internal Medicine

## 2019-07-12 DIAGNOSIS — Z23 Encounter for immunization: Secondary | ICD-10-CM | POA: Insufficient documentation

## 2019-07-12 NOTE — Progress Notes (Signed)
   Covid-19 Vaccination Clinic  Name:  Kristin Brennan    MRN: [id number] DOB: 1951-03-12  07/12/2019  Kristin Brennan was observed post Covid-19 immunization for 15 minutes without incidence. She was provided with Vaccine Information Sheet and instruction to access the V-Safe system.   Kristin Brennan was instructed to call 911 with any severe reactions post vaccine: Marland Kitchen Difficulty breathing  . Swelling of your face and throat  . A fast heartbeat  . A bad rash all over your body  . Dizziness and weakness    Immunizations Administered    Name Date Dose VIS Date Route   Pfizer COVID-19 Vaccine 07/12/2019  3:20 PM 0.3 mL 05/02/2019 Intramuscular   Manufacturer: [place]   Lot: [id number]   [place]: [id number]

## 2019-08-05 ENCOUNTER — Ambulatory Visit: Payer: Medicare HMO | Attending: Internal Medicine

## 2019-08-05 DIAGNOSIS — Z23 Encounter for immunization: Secondary | ICD-10-CM

## 2019-08-05 NOTE — Progress Notes (Signed)
   Covid-19 Vaccination Clinic  Name:  Kristin Brennan    MRN: [id number] DOB: 1950-06-04  08/05/2019  Kristin Brennan was observed post Covid-19 immunization for 15 minutes without incident. She was provided with Vaccine Information Sheet and instruction to access the V-Safe system.   Kristin Brennan was instructed to call 911 with any severe reactions post vaccine: Marland Kitchen Difficulty breathing  . Swelling of face and throat  . A fast heartbeat  . A bad rash all over body  . Dizziness and weakness   Immunizations Administered    Name Date Dose VIS Date Route   Pfizer COVID-19 Vaccine 08/05/2019  2:11 PM 0.3 mL 05/02/2019 Intramuscular   Manufacturer: [place]   Lot: [id number]   [place]: [id number]

## 2019-08-25 DIAGNOSIS — H2513 Age-related nuclear cataract, bilateral: Secondary | ICD-10-CM | POA: Diagnosis not present

## 2019-08-25 DIAGNOSIS — H5213 Myopia, bilateral: Secondary | ICD-10-CM | POA: Diagnosis not present

## 2019-09-17 DIAGNOSIS — E7800 Pure hypercholesterolemia, unspecified: Secondary | ICD-10-CM | POA: Diagnosis not present

## 2019-09-17 DIAGNOSIS — I10 Essential (primary) hypertension: Secondary | ICD-10-CM | POA: Diagnosis not present

## 2019-09-17 DIAGNOSIS — R5383 Other fatigue: Secondary | ICD-10-CM | POA: Diagnosis not present

## 2019-09-17 DIAGNOSIS — Z0000 Encounter for general adult medical examination without abnormal findings: Secondary | ICD-10-CM | POA: Diagnosis not present

## 2019-09-17 DIAGNOSIS — R69 Illness, unspecified: Secondary | ICD-10-CM | POA: Diagnosis not present

## 2019-09-17 DIAGNOSIS — M150 Primary generalized (osteo)arthritis: Secondary | ICD-10-CM | POA: Diagnosis not present

## 2019-09-24 ENCOUNTER — Other Ambulatory Visit: Payer: Self-pay | Admitting: Internal Medicine

## 2019-09-24 DIAGNOSIS — E7800 Pure hypercholesterolemia, unspecified: Secondary | ICD-10-CM | POA: Diagnosis not present

## 2019-09-24 DIAGNOSIS — F17200 Nicotine dependence, unspecified, uncomplicated: Secondary | ICD-10-CM

## 2019-09-24 DIAGNOSIS — R69 Illness, unspecified: Secondary | ICD-10-CM | POA: Diagnosis not present

## 2019-09-24 DIAGNOSIS — M545 Low back pain: Secondary | ICD-10-CM | POA: Diagnosis not present

## 2019-09-24 DIAGNOSIS — I10 Essential (primary) hypertension: Secondary | ICD-10-CM | POA: Diagnosis not present

## 2019-09-24 DIAGNOSIS — E559 Vitamin D deficiency, unspecified: Secondary | ICD-10-CM | POA: Diagnosis not present

## 2019-10-08 ENCOUNTER — Ambulatory Visit
Admission: RE | Admit: 2019-10-08 | Discharge: 2019-10-08 | Disposition: A | Discharge status: Home / Self Care | Payer: Medicare HMO | Source: Ambulatory Visit | Attending: Internal Medicine | Admitting: Internal Medicine

## 2019-10-08 DIAGNOSIS — F17200 Nicotine dependence, unspecified, uncomplicated: Secondary | ICD-10-CM

## 2019-10-08 DIAGNOSIS — R69 Illness, unspecified: Secondary | ICD-10-CM | POA: Diagnosis not present

## 2019-10-14 DIAGNOSIS — M25512 Pain in left shoulder: Secondary | ICD-10-CM | POA: Diagnosis not present

## 2019-10-14 DIAGNOSIS — M7542 Impingement syndrome of left shoulder: Secondary | ICD-10-CM | POA: Diagnosis not present

## 2019-10-14 DIAGNOSIS — M545 Low back pain: Secondary | ICD-10-CM | POA: Diagnosis not present

## 2019-11-04 DIAGNOSIS — M25512 Pain in left shoulder: Secondary | ICD-10-CM | POA: Diagnosis not present

## 2019-11-04 DIAGNOSIS — M5416 Radiculopathy, lumbar region: Secondary | ICD-10-CM | POA: Diagnosis not present

## 2019-11-04 DIAGNOSIS — M7542 Impingement syndrome of left shoulder: Secondary | ICD-10-CM | POA: Diagnosis not present

## 2019-11-04 DIAGNOSIS — M545 Low back pain: Secondary | ICD-10-CM | POA: Diagnosis not present

## 2019-11-18 ENCOUNTER — Other Ambulatory Visit: Payer: Self-pay

## 2019-11-18 ENCOUNTER — Ambulatory Visit: Payer: Medicare HMO | Attending: Specialist | Admitting: Physical Therapy

## 2019-11-18 ENCOUNTER — Encounter: Payer: Self-pay | Admitting: Physical Therapy

## 2019-11-18 DIAGNOSIS — M545 Low back pain, unspecified: Secondary | ICD-10-CM

## 2019-11-18 DIAGNOSIS — R252 Cramp and spasm: Secondary | ICD-10-CM

## 2019-11-18 DIAGNOSIS — M25512 Pain in left shoulder: Secondary | ICD-10-CM | POA: Diagnosis not present

## 2019-11-18 NOTE — Patient Instructions (Signed)
Access Code: [email or web address] URL: [email or web address] Date: 11/18/2019 Prepared by: Lum Babe  Exercises Hooklying Single Knee to Chest Stretch - 2 x daily - 7 x weekly - 1 sets - 10 reps - 10 hold Supine Double Knee to Chest - 2 x daily - 7 x weekly - 1 sets - 10 reps - 10 hold Supine Lower Trunk Rotation - 2 x daily - 7 x weekly - 1 sets - 10 reps - 10 hold Hooklying Hamstring Stretch with Strap - 2 x daily - 7 x weekly - 1 sets - 10 reps - 30 hold Supine Piriformis Stretch Pulling Heel to Hip - 2 x daily - 7 x weekly - 1 sets - 10 reps - 30 hold Seated Scapular Retraction - 2 x daily - 7 x weekly - 1 sets - 10 reps - 3 hold

## 2019-11-18 NOTE — Therapy (Signed)
[place], [place], [address] Phone: [phone]   Fax:  [phone]  Physical Therapy Evaluation  Patient Details  Name: Kristin Brennan MRN: [id number] Date of Birth: [birth year] Referring Provider (PT): Beane   Encounter Date: 11/18/2019   PT End of Session - 11/18/19 1057    Visit Number 1    Date for PT Re-Evaluation 01/18/20    PT Start Time 1005    PT Stop Time 1045    PT Time Calculation (min) 40 min    Activity Tolerance Patient tolerated treatment well    Behavior During Therapy [facility] for tasks assessed/performed           Past Medical History:  Diagnosis Date  . Arthritis    fingers  . Cancer ([place]) 11/2016   right breast DCIS  . Smoker    1/2-3/4 PPD    Past Surgical History:  Procedure Laterality Date  . BLADDER SUSPENSION    . BREAST LUMPECTOMY Right 11/2016  . BREAST LUMPECTOMY WITH RADIOACTIVE SEED LOCALIZATION Right 12/18/2016   Procedure: BREAST LUMPECTOMY WITH RADIOACTIVE SEED LOCALIZATION;  Surgeon: Rolm Bookbinder, MD;  Location: [place];  Service: General;  Laterality: Right;    There were no vitals filed for this visit.    Subjective Assessment - 11/18/19 1012    Subjective Patient reports that she has had some issues with her back since last fall, she reports bending over and lifting.  She reports that she had excruciating pain for about 2-3days, with pain in the right hip.  She reports that x-rays showed DDD.  She also reports left shoulder pain, she is unsure of an injury, xrays showed arthritis.  She reports an injection in the left shoulder wihtout help.    Patient Stated Goals have less pain, reports difficulty with activity just really hurts    Currently in Pain? Yes    Pain Score 3     Pain Location Back    Pain Orientation Lower;Left    Pain Descriptors / Indicators Aching    Pain Type Acute pain    Pain Radiating Towards  reports some pain into the hips and the thighs    Pain Onset More than a month ago    Pain Frequency Constant    Aggravating Factors  pain can be up to 7/10, reports more activity will increase the pain, shoulder pain is worse with reaching out and up    Pain Relieving Factors ice, heat, advil, at best a 3/10    Effect of Pain on Daily Activities reports that she can do most things just really hurts.              [facility] PT Assessment - 11/18/19 0001      Assessment   Medical Diagnosis LBP, left shoulder pain    Referring Provider (PT) Beane    Onset Date/Surgical Date 05/20/19    Hand Dominance Right    Prior Therapy no      Precautions   Precautions None      Balance Screen   Has the patient fallen in the past 6 months No    Has the patient had a decrease in activity level because of a fear of falling?  No    Is the patient reluctant to leave their home because of a fear of falling?  No      Home Environment   Additional Comments does housework, yardwork  Prior Function   Level of Independence Independent    Vocation Retired    Leisure likes to walk      [employer] Comments rounded shoulders, fwd head      ROM / Strength   AROM / PROM / Strength AROM;Strength      AROM   Overall AROM Comments Lumbar ROM decreased 50% with pain for extension and side bending, shoulder AROM is about equal  but with crepitus in the left shoulder and pain      Strength   Overall Strength Comments hips 4-/5 feels "weak", shoulder 4-/5 with pain in the left shoulder, weak core      Flexibility   Soft Tissue Assessment /Muscle Length yes    Hamstrings very tight 60 degree SLR with pain    ITB tight    Piriformis very tight      Palpation   Palpation comment she is tight in the low back and hips, non tender, crepitus in the left shoulder, tight in the upper trap and pecs                      Objective measurements completed on examination:  See above findings.                 PT Short Term Goals - 11/18/19 1153      PT SHORT TERM GOAL #1   Title independent with initial HEP    Time 2    Period Weeks    Status New             PT Long Term Goals - 11/18/19 1157      PT LONG TERM GOAL #1   Title understand posture and body mechanics    Time 8    Period Weeks    Status New      PT LONG TERM GOAL #2   Title decrease pain 50%    Time 8    Period Weeks    Status New      PT LONG TERM GOAL #3   Title increase lumbar ROM 25%    Time 8    Period Weeks    Status New      PT LONG TERM GOAL #4   Title decrease pain 50%    Time 8    Period Weeks    Status New                  Plan - 11/18/19 1153    Clinical Impression Statement Patient with LBP x 6 months thinks it was when she was bending over and lifting something at an awkward angle and had back pain, she also has left shoulder pain, the shoulder pain she is not sure what caused the pain, x-rays show DDD of the lumbar area, and shoulder showed arthritis.  She has limitations in lumbar ROM, she is very tight in the LE's, very weak core, the shoulder ROM is WNL's except for IR was limited, She does have poor posture with rounded shoulders and forward head.    Stability/Clinical Decision Making Stable/Uncomplicated    Clinical Decision Making Low    Rehab Potential Good    PT Frequency 2x / week    PT Duration 8 weeks    PT Treatment/Interventions ADLs/Self Care Home Management;Cryotherapy;Electrical Stimulation;Iontophoresis 4mg /ml Dexamethasone;Moist Heat;Traction;Ultrasound;Therapeutic exercise;Therapeutic activities;Manual techniques;Patient/family education    PT Next Visit Plan will start with core strength and flexibility, educate on posture  and body mechanics    Consulted and Agree with Plan of Care Patient           Patient will benefit from skilled therapeutic intervention in order to improve the following deficits and  impairments:  Decreased range of motion, Difficulty walking, Impaired UE functional use, Increased muscle spasms, Decreased activity tolerance, Pain, Improper body mechanics, Impaired flexibility, Decreased strength, Postural dysfunction  Visit Diagnosis: Acute bilateral low back pain without sciatica - Plan: PT plan of care cert/re-cert  Acute pain of left shoulder - Plan: PT plan of care cert/re-cert  Cramp and spasm - Plan: PT plan of care cert/re-cert     Problem List Patient Active Problem List   Diagnosis Date Noted  . Ductal carcinoma in situ (DCIS) of right breast 11/20/2016  . Weakness 09/22/2016    Sumner Boast., PT 11/18/2019, 12:37 PM  [place] [address] [place], [place], [address] Phone: [phone]   Fax:  [phone]  Name: Kristin Brennan MRN: [id number] Date of Birth: July 16, 1950

## 2019-11-27 ENCOUNTER — Ambulatory Visit: Payer: Medicare HMO | Attending: Specialist | Admitting: Physical Therapy

## 2019-11-27 ENCOUNTER — Other Ambulatory Visit: Payer: Self-pay

## 2019-11-27 ENCOUNTER — Encounter: Payer: Self-pay | Admitting: Physical Therapy

## 2019-11-27 DIAGNOSIS — M545 Low back pain, unspecified: Secondary | ICD-10-CM

## 2019-11-27 DIAGNOSIS — M25512 Pain in left shoulder: Secondary | ICD-10-CM | POA: Insufficient documentation

## 2019-11-27 DIAGNOSIS — R252 Cramp and spasm: Secondary | ICD-10-CM | POA: Diagnosis not present

## 2019-11-27 NOTE — Therapy (Signed)
[place], [place], [address] Phone: [phone]   Fax:  [phone]  Physical Therapy Treatment  Patient Details  Name: Kristin Brennan MRN: [id number] Date of Birth: 1950-07-08 Referring Provider (PT): Beane   Encounter Date: 11/27/2019   PT End of Session - 11/27/19 0926    Visit Number 2    Date for PT Re-Evaluation 01/18/20    PT Start Time 0845    PT Stop Time 0928    PT Time Calculation (min) 43 min    Activity Tolerance Patient tolerated treatment well    Behavior During Therapy [facility] for tasks assessed/performed           Past Medical History:  Diagnosis Date  . Arthritis    fingers  . Cancer ([place]) 11/2016   right breast DCIS  . Smoker    1/2-3/4 PPD    Past Surgical History:  Procedure Laterality Date  . BLADDER SUSPENSION    . BREAST LUMPECTOMY Right 11/2016  . BREAST LUMPECTOMY WITH RADIOACTIVE SEED LOCALIZATION Right 12/18/2016   Procedure: BREAST LUMPECTOMY WITH RADIOACTIVE SEED LOCALIZATION;  Surgeon: Rolm Bookbinder, MD;  Location: [place];  Service: General;  Laterality: Right;    There were no vitals filed for this visit.   Subjective Assessment - 11/27/19 0850    Subjective Feeling ok    Currently in Pain? No/denies                             [facility] Adult PT Treatment/Exercise - 11/27/19 0001      Exercises   Exercises Lumbar      Lumbar Exercises: Aerobic   Nustep L4 x 6 min       Lumbar Exercises: Standing   Row Theraband;Both;20 reps    Shoulder Extension 20 reps;Theraband;Both    Theraband Level (Shoulder Extension) Level 2 (Red)      Lumbar Exercises: Seated   Sit to Stand 5 reps   x2      Lumbar Exercises: Supine   Ab Set 3 seconds;20 reps    Bridge Non-compliant;20 reps;2 seconds    Other Supine Lumbar Exercises Pball bridges, K2C, oblq      Manual Therapy   Manual Therapy Passive ROM;Joint  mobilization    Joint Mobilization R GH grades 1-2                    PT Short Term Goals - 11/18/19 1153      PT SHORT TERM GOAL #1   Title independent with initial HEP    Time 2    Period Weeks    Status New             PT Long Term Goals - 11/18/19 1157      PT LONG TERM GOAL #1   Title understand posture and body mechanics    Time 8    Period Weeks    Status New      PT LONG TERM GOAL #2   Title decrease pain 50%    Time 8    Period Weeks    Status New      PT LONG TERM GOAL #3   Title increase lumbar ROM 25%    Time 8    Period Weeks    Status New      PT LONG TERM GOAL #4   Title decrease pain  50%    Time 8    Period Weeks    Status New                 Plan - 11/27/19 0211    Clinical Impression Statement Pt did well with an initial progression to TE. She did well with sit to stands, postural cues needed with rows and lats. She did report some L shoulder pain with rows initially. Core weakness noted with supine interventions. initial L shoulder flexion and IR tightness that improved as MT progressed.    Stability/Clinical Decision Making Stable/Uncomplicated    Rehab Potential Good    PT Frequency 2x / week    PT Duration 8 weeks    PT Treatment/Interventions ADLs/Self Care Home Management;Cryotherapy;Electrical Stimulation;Iontophoresis 4mg /ml Dexamethasone;Moist Heat;Traction;Ultrasound;Therapeutic exercise;Therapeutic activities;Manual techniques;Patient/family education    PT Next Visit Plan will start with core strength and flexibility, educate on posture and body mechanics           Patient will benefit from skilled therapeutic intervention in order to improve the following deficits and impairments:  Decreased range of motion, Difficulty walking, Impaired UE functional use, Increased muscle spasms, Decreased activity tolerance, Pain, Improper body mechanics, Impaired flexibility, Decreased strength, Postural dysfunction  Visit  Diagnosis: Acute bilateral low back pain without sciatica  Acute pain of left shoulder  Cramp and spasm     Problem List Patient Active Problem List   Diagnosis Date Noted  . Ductal carcinoma in situ (DCIS) of right breast 11/20/2016  . Weakness 09/22/2016    Scot Jun, PTA 11/27/2019, 9:31 AM  [place], [place], [address] Phone: [phone]   Fax:  [phone]  Name: Kristin Brennan MRN: [id number] Date of Birth: 09/12/1950

## 2019-12-02 ENCOUNTER — Other Ambulatory Visit: Payer: Self-pay

## 2019-12-02 ENCOUNTER — Ambulatory Visit: Payer: Medicare HMO | Admitting: Physical Therapy

## 2019-12-02 ENCOUNTER — Encounter: Payer: Self-pay | Admitting: Physical Therapy

## 2019-12-02 DIAGNOSIS — M545 Low back pain, unspecified: Secondary | ICD-10-CM

## 2019-12-02 DIAGNOSIS — R252 Cramp and spasm: Secondary | ICD-10-CM

## 2019-12-02 DIAGNOSIS — M25512 Pain in left shoulder: Secondary | ICD-10-CM

## 2019-12-02 NOTE — Therapy (Signed)
[place], [place], [address] Phone: [phone]   Fax:  [phone]  Physical Therapy Treatment  Patient Details  Name: Kristin Brennan MRN: [id number] Date of Birth: [birth year] Referring Provider (PT): Beane   Encounter Date: 12/02/2019   PT End of Session - 12/02/19 0928    Visit Number 3    Date for PT Re-Evaluation 01/18/20    PT Start Time 0850    PT Stop Time 0930    PT Time Calculation (min) 40 min    Activity Tolerance Patient tolerated treatment well    Behavior During Therapy [facility] for tasks assessed/performed           Past Medical History:  Diagnosis Date  . Arthritis    fingers  . Cancer ([place]) 11/2016   right breast DCIS  . Smoker    1/2-3/4 PPD    Past Surgical History:  Procedure Laterality Date  . BLADDER SUSPENSION    . BREAST LUMPECTOMY Right 11/2016  . BREAST LUMPECTOMY WITH RADIOACTIVE SEED LOCALIZATION Right 12/18/2016   Procedure: BREAST LUMPECTOMY WITH RADIOACTIVE SEED LOCALIZATION;  Surgeon: Rolm Bookbinder, MD;  Location: [place];  Service: General;  Laterality: Right;    There were no vitals filed for this visit.   Subjective Assessment - 12/02/19 0852    Subjective Pt reports she is feeling better overall    Currently in Pain? No/denies                             [facility] Adult PT Treatment/Exercise - 12/02/19 0001      Lumbar Exercises: Aerobic   Recumbent Bike x 6 min      Lumbar Exercises: Machines for Strengthening   Other Lumbar Machine Exercise rows and lats 15# 2x10      Lumbar Exercises: Supine   Bridge 10 reps;3 seconds   with PPT   Other Supine Lumbar Exercises lower trunk rotations, deadbugs 2x5, sktc with piriformis stretch, dktc on ex ball x10      Manual Therapy   Manual Therapy Passive ROM;Joint mobilization    Manual therapy comments STM to L lumbar paraspinals and L glute/piriformis     Joint Mobilization L GH grades 1-2                    PT Short Term Goals - 11/18/19 1153      PT SHORT TERM GOAL #1   Title independent with initial HEP    Time 2    Period Weeks    Status New             PT Long Term Goals - 11/18/19 1157      PT LONG TERM GOAL #1   Title understand posture and body mechanics    Time 8    Period Weeks    Status New      PT LONG TERM GOAL #2   Title decrease pain 50%    Time 8    Period Weeks    Status New      PT LONG TERM GOAL #3   Title increase lumbar ROM 25%    Time 8    Period Weeks    Status New      PT LONG TERM GOAL #4   Title decrease pain 50%    Time 8    Period Weeks  Status New                 Plan - 12/02/19 3545    Clinical Impression Statement Pt did well with progression of TE; some complaints of increased L LBP with deadbugs and supine bridges; noted core weakness. Pt reported relief with L shoulder mobs and PROM.    PT Treatment/Interventions ADLs/Self Care Home Management;Cryotherapy;Electrical Stimulation;Iontophoresis 4mg /ml Dexamethasone;Moist Heat;Traction;Ultrasound;Therapeutic exercise;Therapeutic activities;Manual techniques;Patient/family education    PT Next Visit Plan will start with core strength and flexibility, educate on posture and body mechanics    Consulted and Agree with Plan of Care Patient           Patient will benefit from skilled therapeutic intervention in order to improve the following deficits and impairments:  Decreased range of motion, Difficulty walking, Impaired UE functional use, Increased muscle spasms, Decreased activity tolerance, Pain, Improper body mechanics, Impaired flexibility, Decreased strength, Postural dysfunction  Visit Diagnosis: Acute bilateral low back pain without sciatica  Acute pain of left shoulder  Cramp and spasm     Problem List Patient Active Problem List   Diagnosis Date Noted  . Ductal carcinoma in situ (DCIS) of  right breast 11/20/2016  . Weakness 09/22/2016   Amador Cunas, PT, DPT Donald Prose Jovon [place] 12/02/2019, 9:30 AM  [place], [place], [address] Phone: [phone]   Fax:  [phone]  Name: Kristin Brennan MRN: [id number] Date of Birth: [birth year]

## 2019-12-04 ENCOUNTER — Other Ambulatory Visit: Payer: Self-pay

## 2019-12-04 ENCOUNTER — Encounter: Payer: Self-pay | Admitting: Physical Therapy

## 2019-12-04 ENCOUNTER — Ambulatory Visit: Payer: Medicare HMO | Admitting: Physical Therapy

## 2019-12-04 DIAGNOSIS — M25512 Pain in left shoulder: Secondary | ICD-10-CM

## 2019-12-04 DIAGNOSIS — R252 Cramp and spasm: Secondary | ICD-10-CM | POA: Diagnosis not present

## 2019-12-04 DIAGNOSIS — M545 Low back pain, unspecified: Secondary | ICD-10-CM

## 2019-12-04 NOTE — Therapy (Signed)
[place], [place], [address] Phone: [phone]   Fax:  [phone]  Physical Therapy Treatment  Patient Details  Name: CAEDENCE SNOWDEN MRN: [id number] Date of Birth: 03/24/1951 Referring Provider (PT): Beane   Encounter Date: 12/04/2019   PT End of Session - 12/04/19 1701    Visit Number 4    Date for PT Re-Evaluation 01/18/20    PT Start Time 1615    PT Stop Time 1700    PT Time Calculation (min) 45 min    Activity Tolerance Patient tolerated treatment well    Behavior During Therapy [facility] for tasks assessed/performed           Past Medical History:  Diagnosis Date  . Arthritis    fingers  . Cancer ([place]) 11/2016   right breast DCIS  . Smoker    1/2-3/4 PPD    Past Surgical History:  Procedure Laterality Date  . BLADDER SUSPENSION    . BREAST LUMPECTOMY Right 11/2016  . BREAST LUMPECTOMY WITH RADIOACTIVE SEED LOCALIZATION Right 12/18/2016   Procedure: BREAST LUMPECTOMY WITH RADIOACTIVE SEED LOCALIZATION;  Surgeon: Rolm Bookbinder, MD;  Location: [place];  Service: General;  Laterality: Right;    There were no vitals filed for this visit.   Subjective Assessment - 12/04/19 1618    Subjective Pt reports she did not like STM; feeling better today    Currently in Pain? Yes    Pain Score 3     Pain Location Back    Pain Orientation Lower;Left                             [facility] Adult PT Treatment/Exercise - 12/04/19 0001      Lumbar Exercises: Aerobic   Elliptical L5 x 7 min      Lumbar Exercises: Standing   Row Theraband;Both;20 reps    Theraband Level (Row) Level 2 (Red)    Shoulder Extension 20 reps;Theraband;Both    Theraband Level (Shoulder Extension) Level 2 (Red)    Other Standing Lumbar Exercises shoulder IR/ER with red TB 1x10 B    Other Standing Lumbar Exercises wall squats and back ext over exercise ball x10       Lumbar Exercises: Seated   Other Seated Lumbar Exercises seated fwd/lat flexion      Manual Therapy   Manual Therapy Passive ROM;Joint mobilization    Joint Mobilization L GH grades 1-2    Passive ROM L shoulder all directions                    PT Short Term Goals - 11/18/19 1153      PT SHORT TERM GOAL #1   Title independent with initial HEP    Time 2    Period Weeks    Status New             PT Long Term Goals - 11/18/19 1157      PT LONG TERM GOAL #1   Title understand posture and body mechanics    Time 8    Period Weeks    Status New      PT LONG TERM GOAL #2   Title decrease pain 50%    Time 8    Period Weeks    Status New      PT LONG TERM GOAL #3   Title increase lumbar ROM 25%  Time 8    Period Weeks    Status New      PT LONG TERM GOAL #4   Title decrease pain 50%    Time 8    Period Weeks    Status New                 Plan - 12/04/19 1701    Clinical Impression Statement Pt tolerated progression of TE well. Reports of increased L hip pain following STM last rx so discontinued that this rx. Pt demos core weakness with shoulder extensions and supine ex's.    PT Treatment/Interventions ADLs/Self Care Home Management;Cryotherapy;Electrical Stimulation;Iontophoresis 4mg /ml Dexamethasone;Moist Heat;Traction;Ultrasound;Therapeutic exercise;Therapeutic activities;Manual techniques;Patient/family education    PT Next Visit Plan will start with core strength and flexibility, educate on posture and body mechanics    Consulted and Agree with Plan of Care Patient           Patient will benefit from skilled therapeutic intervention in order to improve the following deficits and impairments:  Decreased range of motion, Difficulty walking, Impaired UE functional use, Increased muscle spasms, Decreased activity tolerance, Pain, Improper body mechanics, Impaired flexibility, Decreased strength, Postural dysfunction  Visit Diagnosis: Acute  bilateral low back pain without sciatica  Acute pain of left shoulder  Cramp and spasm     Problem List Patient Active Problem List   Diagnosis Date Noted  . Ductal carcinoma in situ (DCIS) of right breast 11/20/2016  . Weakness 09/22/2016   Amador Cunas, PT, DPT Donald Prose Suzanne Kho 12/04/2019, 5:04 PM  [place] [address] [place], [place], [address] Phone: [phone]   Fax:  [phone]  Name: SABINE TENENBAUM MRN: [id number] Date of Birth: Dec 17, 1950

## 2019-12-16 ENCOUNTER — Ambulatory Visit: Payer: Medicare HMO | Admitting: Physical Therapy

## 2019-12-16 ENCOUNTER — Other Ambulatory Visit: Payer: Self-pay

## 2019-12-16 ENCOUNTER — Encounter: Payer: Self-pay | Admitting: Physical Therapy

## 2019-12-16 DIAGNOSIS — R252 Cramp and spasm: Secondary | ICD-10-CM

## 2019-12-16 DIAGNOSIS — M545 Low back pain, unspecified: Secondary | ICD-10-CM

## 2019-12-16 DIAGNOSIS — M25512 Pain in left shoulder: Secondary | ICD-10-CM | POA: Diagnosis not present

## 2019-12-16 NOTE — Therapy (Signed)
[place], [place], [address] Phone: [phone]   Fax:  [phone]  Physical Therapy Treatment  Patient Details  Name: Kristin Brennan MRN: [id number] Date of Birth: [birth year] Referring Provider (PT): Beane   Encounter Date: 12/16/2019   PT End of Session - 12/16/19 0936    Visit Number 5    Date for PT Re-Evaluation 01/18/20    PT Start Time 0845    PT Stop Time 0930    PT Time Calculation (min) 45 min    Activity Tolerance Patient tolerated treatment well    Behavior During Therapy [facility] for tasks assessed/performed           Past Medical History:  Diagnosis Date  . Arthritis    fingers  . Cancer ([place]) 11/2016   right breast DCIS  . Smoker    1/2-3/4 PPD    Past Surgical History:  Procedure Laterality Date  . BLADDER SUSPENSION    . BREAST LUMPECTOMY Right 11/2016  . BREAST LUMPECTOMY WITH RADIOACTIVE SEED LOCALIZATION Right 12/18/2016   Procedure: BREAST LUMPECTOMY WITH RADIOACTIVE SEED LOCALIZATION;  Surgeon: Rolm Bookbinder, MD;  Location: [place];  Service: General;  Laterality: Right;    There were no vitals filed for this visit.   Subjective Assessment - 12/16/19 0851    Subjective Pt reports her LB feels uncomfortable, but not painful, today.    Currently in Pain? Yes    Pain Score 1     Pain Location Back                             [facility] Adult PT Treatment/Exercise - 12/16/19 0001      Lumbar Exercises: Stretches   Active Hamstring Stretch Right;Left;1 rep;30 seconds    Piriformis Stretch Right;Left;1 rep;30 seconds      Lumbar Exercises: Standing   Heel Raises 15 reps    Other Standing Lumbar Exercises sidestepping x10 B with green TB; hip extension with green TB at ankles    Other Standing Lumbar Exercises wall squats x20 sec hold x3; back extension on counter x10      Lumbar Exercises: Seated   Sit to Stand  20 reps   with yellow ball chest press     Lumbar Exercises: Supine   Dead Bug 15 reps;3 seconds    Bridge with Ball Squeeze 10 reps;3 seconds      Lumbar Exercises: Sidelying   Other Sidelying Lumbar Exercises sidelying open book thoracic rotation x10 B      Lumbar Exercises: Quadruped   Plank side plank x10 sec hold x2 B                  PT Education - 12/16/19 0935    Education Details Pt educated on updated HEP; pt will block exercises and do half each day    Person(s) Educated Patient    Methods Explanation;Demonstration;Handout    Comprehension Verbalized understanding;Returned demonstration            PT Short Term Goals - 12/16/19 0851      PT SHORT TERM GOAL #1   Title independent with initial HEP    Time 2    Period Weeks    Status Achieved             PT Long Term Goals - 12/16/19 8338      PT LONG TERM  GOAL #1   Title understand posture and body mechanics    Time 8    Period Weeks    Status Achieved      PT LONG TERM GOAL #2   Title decrease pain 50%    Time 8    Period Weeks    Status Partially Met      PT LONG TERM GOAL #3   Title increase lumbar ROM 25%    Time 8    Period Weeks    Status Partially Met      PT LONG TERM GOAL #4   Title decrease pain 50%    Time 8    Period Weeks    Status Partially Met                 Plan - 12/16/19 0936    Clinical Impression Statement Pt requested updated HEP with more ex's for core stab and to help getting up/down off floor. Updated pt HEP (see pt instructions) and instructed to perform half of ex's each day. Pt tolerated progression of TE well with no complaints of increased LBP. Some report of increased L shoulder pain with open book thoracic rotations. Cues to avoid compensations with hip extensions.    PT Treatment/Interventions ADLs/Self Care Home Management;Cryotherapy;Electrical Stimulation;Iontophoresis 75m/ml Dexamethasone;Moist Heat;Traction;Ultrasound;Therapeutic  exercise;Therapeutic activities;Manual techniques;Patient/family education    PT Next Visit Plan will start with core strength and flexibility, educate on posture and body mechanics    Consulted and Agree with Plan of Care Patient           Patient will benefit from skilled therapeutic intervention in order to improve the following deficits and impairments:  Decreased range of motion, Difficulty walking, Impaired UE functional use, Increased muscle spasms, Decreased activity tolerance, Pain, Improper body mechanics, Impaired flexibility, Decreased strength, Postural dysfunction  Visit Diagnosis: Acute bilateral low back pain without sciatica  Acute pain of left shoulder  Cramp and spasm     Problem List Patient Active Problem List   Diagnosis Date Noted  . Ductal carcinoma in situ (DCIS) of right breast 11/20/2016  . Weakness 09/22/2016   AAmador Cunas PT, DPT ADonald ProseSugg 12/16/2019, 9:38 AM  [address] [address]Phone: [phone]  Fax:  [phone] Name: Kristin URBANEKMRN: [id number] of Birth: 8January 28, 1952

## 2019-12-16 NOTE — Patient Instructions (Signed)
Access Code: [email or web address] URL: [email or web address] Date: 12/16/2019 Prepared by: [place] - 1 x daily - 7 x weekly - 3 sets - 10 reps Supine Bridge with Mini Swiss Ball Between Knees - 1 x daily - 7 x weekly - 3 sets - 10 reps Side Plank on Knees - 1 x daily - 7 x weekly - 3 sets - 10 reps Sidelying Thoracic Rotation with Open Book - 1 x daily - 7 x weekly - 3 sets - 10 reps Side Stepping with Resistance at Ankles - 1 x daily - 7 x weekly - 3 sets - 10 reps Hip Extension with Resistance Loop - 1 x daily - 7 x weekly - 3 sets - 10 reps Seated Piriformis Stretch with Trunk Bend - 1 x daily - 7 x weekly - 3 sets - 10 reps Seated Table Hamstring Stretch - 1 x daily - 7 x weekly - 3 sets - 10 reps Standing Lumbar Extension with Counter - 1 x daily - 7 x weekly - 3 sets - 10 reps Wall Squat - 1 x daily - 7 x weekly - 3 sets - 3 reps - 20 sec hold

## 2019-12-18 ENCOUNTER — Ambulatory Visit: Payer: Medicare HMO | Admitting: Physical Therapy

## 2019-12-18 ENCOUNTER — Encounter: Payer: Self-pay | Admitting: Physical Therapy

## 2019-12-18 ENCOUNTER — Other Ambulatory Visit: Payer: Self-pay

## 2019-12-18 DIAGNOSIS — M25512 Pain in left shoulder: Secondary | ICD-10-CM | POA: Diagnosis not present

## 2019-12-18 DIAGNOSIS — R252 Cramp and spasm: Secondary | ICD-10-CM | POA: Diagnosis not present

## 2019-12-18 DIAGNOSIS — M545 Low back pain, unspecified: Secondary | ICD-10-CM

## 2019-12-18 NOTE — Therapy (Signed)
[place], [place], [address] Phone: [phone]   Fax:  [phone]  Physical Therapy Treatment  Patient Details  Name: Kristin Brennan MRN: [id number] Date of Birth: [birth year] Referring Provider (PT): Beane   Encounter Date: 12/18/2019   PT End of Session - 12/18/19 1023    Visit Number 6    Date for PT Re-Evaluation 01/18/20    PT Start Time 0932    PT Stop Time 1015    PT Time Calculation (min) 43 min    Activity Tolerance Patient tolerated treatment well    Behavior During Therapy [facility] for tasks assessed/performed           Past Medical History:  Diagnosis Date  . Arthritis    fingers  . Cancer ([place]) 11/2016   right breast DCIS  . Smoker    1/2-3/4 PPD    Past Surgical History:  Procedure Laterality Date  . BLADDER SUSPENSION    . BREAST LUMPECTOMY Right 11/2016  . BREAST LUMPECTOMY WITH RADIOACTIVE SEED LOCALIZATION Right 12/18/2016   Procedure: BREAST LUMPECTOMY WITH RADIOACTIVE SEED LOCALIZATION;  Surgeon: Rolm Bookbinder, MD;  Location: [place];  Service: General;  Laterality: Right;    There were no vitals filed for this visit.   Subjective Assessment - 12/18/19 0933    Subjective Pt reports soreness in L glute today    Currently in Pain? Yes    Pain Score 1     Pain Location Back    Pain Orientation Left                             [facility] Adult PT Treatment/Exercise - 12/18/19 0001      Lumbar Exercises: Stretches   Active Hamstring Stretch Right;Left;1 rep;30 seconds    Lower Trunk Rotation 5 reps;10 seconds    Piriformis Stretch Right;Left;1 rep;30 seconds      Lumbar Exercises: Aerobic   Nustep L5 x 8 min      Lumbar Exercises: Machines for Strengthening   Leg Press 20# BLE 2x10; heel raises 20# 2x10   unable to complete single leg only press   Other Lumbar Machine Exercise rows and lats 20# 2x10       Lumbar Exercises: Supine   Bridge with Ball Squeeze 3 seconds;20 reps      Lumbar Exercises: Quadruped   Single Arm Raise Right;Left;5 reps    Straight Leg Raise 5 reps    Opposite Arm/Leg Raise Right arm/Left leg;Left arm/Right leg;5 reps    Other Quadruped Lumbar Exercises child's pose fwd/lat x20 sec each direction      Manual Therapy   Manual Therapy Passive ROM    Passive ROM ROM to B hip all directions; prone quad stretching, hamstring stretching                    PT Short Term Goals - 12/16/19 0851      PT SHORT TERM GOAL #1   Title independent with initial HEP    Time 2    Period Weeks    Status Achieved             PT Long Term Goals - 12/16/19 2409      PT LONG TERM GOAL #1   Title understand posture and body mechanics    Time 8    Period Weeks    Status Achieved  PT LONG TERM GOAL #2   Title decrease pain 50%    Time 8    Period Weeks    Status Partially Met      PT LONG TERM GOAL #3   Title increase lumbar ROM 25%    Time 8    Period Weeks    Status Partially Met      PT LONG TERM GOAL #4   Title decrease pain 50%    Time 8    Period Weeks    Status Partially Met                 Plan - 12/18/19 1024    Clinical Impression Statement Pt reports HEP going well at home. States some tightness esp in L LB/piriformis area today; focused on lumbar/hip flexibility and manual stretching/ROM. Pt reported relief with passive stretching/manual this rx. Pt tolerated ex's well with no reports of increased LBP. Pt reports busy schedule and would like to continue HEP at home and return in 2 weeks for follow up.    PT Treatment/Interventions ADLs/Self Care Home Management;Cryotherapy;Electrical Stimulation;Iontophoresis 16m/ml Dexamethasone;Moist Heat;Traction;Ultrasound;Therapeutic exercise;Therapeutic activities;Manual techniques;Patient/family education    PT Next Visit Plan will start with core strength and flexibility, educate on posture  and body mechanics    Consulted and Agree with Plan of Care Patient           Patient will benefit from skilled therapeutic intervention in order to improve the following deficits and impairments:  Decreased range of motion, Difficulty walking, Impaired UE functional use, Increased muscle spasms, Decreased activity tolerance, Pain, Improper body mechanics, Impaired flexibility, Decreased strength, Postural dysfunction  Visit Diagnosis: Acute bilateral low back pain without sciatica  Acute pain of left shoulder  Cramp and spasm     Problem List Patient Active Problem List   Diagnosis Date Noted  . Ductal carcinoma in situ (DCIS) of right breast 11/20/2016  . Weakness 09/22/2016   AAmador Cunas PT, DPT ADonald ProseSugg 12/18/2019, 10:27 AM  [address] [place] [address]Phone: [phone]  Fax:  [phone] Name: Kristin IGARASHIMRN: [id number] of Birth: [birth year]

## 2019-12-22 DIAGNOSIS — R69 Illness, unspecified: Secondary | ICD-10-CM | POA: Diagnosis not present

## 2019-12-24 ENCOUNTER — Other Ambulatory Visit: Payer: Self-pay | Admitting: Obstetrics and Gynecology

## 2019-12-24 DIAGNOSIS — Z9889 Other specified postprocedural states: Secondary | ICD-10-CM

## 2020-01-01 ENCOUNTER — Other Ambulatory Visit: Payer: Self-pay

## 2020-01-01 ENCOUNTER — Ambulatory Visit: Payer: Medicare HMO | Attending: Specialist | Admitting: Physical Therapy

## 2020-01-01 ENCOUNTER — Encounter: Payer: Self-pay | Admitting: Physical Therapy

## 2020-01-01 DIAGNOSIS — M545 Low back pain, unspecified: Secondary | ICD-10-CM

## 2020-01-01 DIAGNOSIS — R252 Cramp and spasm: Secondary | ICD-10-CM | POA: Diagnosis not present

## 2020-01-01 DIAGNOSIS — M25512 Pain in left shoulder: Secondary | ICD-10-CM

## 2020-01-01 NOTE — Therapy (Signed)
[place], [place], [address] Phone: [phone]   Fax:  [phone]  Physical Therapy Treatment  Patient Details  Name: Kristin Brennan MRN: [id number] Date of Birth: Mar 03, 1951 Referring Provider (PT): Beane   Encounter Date: 01/01/2020   PT End of Session - 01/01/20 0925    Visit Number 7    Date for PT Re-Evaluation 01/18/20    PT Start Time 4982    PT Stop Time 0934    PT Time Calculation (min) 47 min    Activity Tolerance Patient tolerated treatment well    Behavior During Therapy [facility] for tasks assessed/performed           Past Medical History:  Diagnosis Date  . Arthritis    fingers  . Cancer ([place]) 11/2016   right breast DCIS  . Smoker    1/2-3/4 PPD    Past Surgical History:  Procedure Laterality Date  . BLADDER SUSPENSION    . BREAST LUMPECTOMY Right 11/2016  . BREAST LUMPECTOMY WITH RADIOACTIVE SEED LOCALIZATION Right 12/18/2016   Procedure: BREAST LUMPECTOMY WITH RADIOACTIVE SEED LOCALIZATION;  Surgeon: Rolm Bookbinder, MD;  Location: [place];  Service: General;  Laterality: Right;    There were no vitals filed for this visit.   Subjective Assessment - 01/01/20 0855    Subjective Pt states that her LB is sore after several hours in the car and mopping the floor this week    Pain Score 4     Pain Location Back                             [facility] Adult PT Treatment/Exercise - 01/01/20 0001      Lumbar Exercises: Stretches   Active Hamstring Stretch Right;Left;1 rep;30 seconds    Single Knee to Chest Stretch Limitations sktc with piriformis stretch x5    Lower Trunk Rotation 5 reps;10 seconds    Piriformis Stretch Right;Left;1 rep;30 seconds      Lumbar Exercises: Aerobic   Nustep L5 x 7 min      Lumbar Exercises: Supine   Bridge with [employer] 3 seconds;10 reps    Bridge with [employer] Limitations bridge with  green TB above knees x10    Straight Leg Raise 5 reps;5 seconds   x2   Straight Leg Raises Limitations cues for iso ab contraction      Modalities   Modalities Moist Heat;Electrical Stimulation      Moist Heat Therapy   Number Minutes Moist Heat 10 Minutes    Moist Heat Location Lumbar Spine      Electrical Stimulation   Electrical Stimulation Location Lumbar    Electrical Stimulation Action IFC    Electrical Stimulation Parameters supine    Electrical Stimulation Goals Pain      Manual Therapy   Manual Therapy Passive ROM    Passive ROM ROM to B hip all directions; hamstring stretching                    PT Short Term Goals - 12/16/19 0851      PT SHORT TERM GOAL #1   Title independent with initial HEP    Time 2    Period Weeks    Status Achieved             PT Long Term Goals - 12/16/19 6415  PT LONG TERM GOAL #1   Title understand posture and body mechanics    Time 8    Period Weeks    Status Achieved      PT LONG TERM GOAL #2   Title decrease pain 50%    Time 8    Period Weeks    Status Partially Met      PT LONG TERM GOAL #3   Title increase lumbar ROM 25%    Time 8    Period Weeks    Status Partially Met      PT LONG TERM GOAL #4   Title decrease pain 50%    Time 8    Period Weeks    Status Partially Met                 Plan - 01/01/20 0926    Clinical Impression Statement Pt demos increased LBP this rx following a long drive and mopping this past week. Focused today's tx on lumbar flexibility, supine core stab, and passive stretching. Pt reported relief with heat and estim.    PT Treatment/Interventions ADLs/Self Care Home Management;Cryotherapy;Electrical Stimulation;Iontophoresis 26m/ml Dexamethasone;Moist Heat;Traction;Ultrasound;Therapeutic exercise;Therapeutic activities;Manual techniques;Patient/family education    PT Next Visit Plan will start with core strength and flexibility, educate on posture and body mechanics     Consulted and Agree with Plan of Care Patient           Patient will benefit from skilled therapeutic intervention in order to improve the following deficits and impairments:  Decreased range of motion, Difficulty walking, Impaired UE functional use, Increased muscle spasms, Decreased activity tolerance, Pain, Improper body mechanics, Impaired flexibility, Decreased strength, Postural dysfunction  Visit Diagnosis: Acute bilateral low back pain without sciatica  Acute pain of left shoulder  Cramp and spasm     Problem List Patient Active Problem List   Diagnosis Date Noted  . Ductal carcinoma in situ (DCIS) of right breast 11/20/2016  . Weakness 09/22/2016   AAmador Cunas PT, DPT ADonald ProseSugg 01/01/2020, 9:28 AM  [address] [address]Phone: [phone]  Fax:  [phone] Name: Kristin MADERAMRN: [id number] of Birth: 805-01-1951

## 2020-01-08 ENCOUNTER — Encounter: Payer: Self-pay | Admitting: Physical Therapy

## 2020-01-08 ENCOUNTER — Other Ambulatory Visit: Payer: Self-pay

## 2020-01-08 ENCOUNTER — Ambulatory Visit: Payer: Medicare HMO | Admitting: Physical Therapy

## 2020-01-08 DIAGNOSIS — M545 Low back pain, unspecified: Secondary | ICD-10-CM

## 2020-01-08 DIAGNOSIS — M25512 Pain in left shoulder: Secondary | ICD-10-CM | POA: Diagnosis not present

## 2020-01-08 DIAGNOSIS — R252 Cramp and spasm: Secondary | ICD-10-CM

## 2020-01-08 NOTE — Therapy (Signed)
[place], [place], [address] Phone: [phone]   Fax:  [phone]  Physical Therapy Treatment  Patient Details  Name: Kristin Brennan MRN: [id number] Date of Birth: February 13, 1951 Referring Provider (PT): Beane   Encounter Date: 01/08/2020   PT End of Session - 01/08/20 0925    Visit Number 8    Date for PT Re-Evaluation 01/18/20    PT Start Time 0848    PT Stop Time 0935    PT Time Calculation (min) 47 min    Activity Tolerance Patient tolerated treatment well    Behavior During Therapy [facility] for tasks assessed/performed           Past Medical History:  Diagnosis Date   Arthritis    fingers   Cancer ([place]) 11/2016   right breast DCIS   Smoker    1/2-3/4 PPD    Past Surgical History:  Procedure Laterality Date   BLADDER SUSPENSION     BREAST LUMPECTOMY Right 11/2016   BREAST LUMPECTOMY WITH RADIOACTIVE SEED LOCALIZATION Right 12/18/2016   Procedure: BREAST LUMPECTOMY WITH RADIOACTIVE SEED LOCALIZATION;  Surgeon: Rolm Bookbinder, MD;  Location: [place];  Service: General;  Laterality: Right;    There were no vitals filed for this visit.   Subjective Assessment - 01/08/20 0855    Subjective Pt states that LB is much worse today; has been having trouble sleeping, feels very sore and tight, states she is having radiating pain on the LLE from hip down to knees    Currently in Pain? Yes    Pain Score 7     Pain Location Back    Pain Orientation Left                             [facility] Adult PT Treatment/Exercise - 01/08/20 0001      Lumbar Exercises: Aerobic   Nustep L3 x 8 min      Moist Heat Therapy   Number Minutes Moist Heat 12 Minutes    Moist Heat Location Lumbar Spine      Electrical Stimulation   Electrical Stimulation Location Lumbar    Electrical Stimulation Action IFC    Electrical Stimulation Parameters supine     Electrical Stimulation Goals Pain      Manual Therapy   Manual Therapy Passive ROM;Joint mobilization;Soft tissue mobilization    Joint Mobilization Grade II-III lumbar    Soft tissue mobilization STM to L lumbar paraspinals and L piriformis    Passive ROM ROM to B hip all directions; hamstring stretching                    PT Short Term Goals - 12/16/19 0851      PT SHORT TERM GOAL #1   Title independent with initial HEP    Time 2    Period Weeks    Status Achieved             PT Long Term Goals - 12/16/19 5027      PT LONG TERM GOAL #1   Title understand posture and body mechanics    Time 8    Period Weeks    Status Achieved      PT LONG TERM GOAL #2   Title decrease pain 50%    Time 8    Period Weeks    Status Partially Met  PT LONG TERM GOAL #3   Title increase lumbar ROM 25%    Time 8    Period Weeks    Status Partially Met      PT LONG TERM GOAL #4   Title decrease pain 50%    Time 8    Period Weeks    Status Partially Met                 Plan - 01/08/20 0926    Clinical Impression Statement Pt demos increased LBP this rx increasing over the past week; unable to tolerate much exercise this rx. Focused on lumbar flexibility and passive stretching. Pt reported relief with heat and estim. Educated pt on importance of continuing to move/complete stretches over the weekend. Pt expressed interest in trying DN next rx.    PT Treatment/Interventions ADLs/Self Care Home Management;Cryotherapy;Electrical Stimulation;Iontophoresis 24m/ml Dexamethasone;Moist Heat;Traction;Ultrasound;Therapeutic exercise;Therapeutic activities;Manual techniques;Patient/family education    PT Next Visit Plan core strength and flexibility, educate on posture and body mechanics           Patient will benefit from skilled therapeutic intervention in order to improve the following deficits and impairments:  Decreased range of motion, Difficulty walking, Impaired UE  functional use, Increased muscle spasms, Decreased activity tolerance, Pain, Improper body mechanics, Impaired flexibility, Decreased strength, Postural dysfunction  Visit Diagnosis: Acute bilateral low back pain without sciatica  Acute pain of left shoulder  Cramp and spasm     Problem List Patient Active Problem List   Diagnosis Date Noted   Ductal carcinoma in situ (DCIS) of right breast 11/20/2016   Weakness 09/22/2016   AAmador Cunas PT, DPT ADonald ProseSugg 01/08/2020, 9:30 AM  [place] [address]Phone: [phone]  Fax:  [phone] Name: Kristin SWILLINGMRN: [id number] of Birth: 8Jul 14, 1952

## 2020-01-13 ENCOUNTER — Other Ambulatory Visit: Payer: Self-pay

## 2020-01-13 ENCOUNTER — Ambulatory Visit
Admission: RE | Admit: 2020-01-13 | Discharge: 2020-01-13 | Disposition: A | Discharge status: Home / Self Care | Payer: Medicare HMO | Source: Ambulatory Visit | Attending: Obstetrics and Gynecology | Admitting: Obstetrics and Gynecology

## 2020-01-13 ENCOUNTER — Other Ambulatory Visit: Payer: Self-pay | Admitting: Obstetrics and Gynecology

## 2020-01-13 DIAGNOSIS — R928 Other abnormal and inconclusive findings on diagnostic imaging of breast: Secondary | ICD-10-CM | POA: Diagnosis not present

## 2020-01-13 DIAGNOSIS — Z9889 Other specified postprocedural states: Secondary | ICD-10-CM

## 2020-01-13 DIAGNOSIS — R921 Mammographic calcification found on diagnostic imaging of breast: Secondary | ICD-10-CM

## 2020-01-13 DIAGNOSIS — Z853 Personal history of malignant neoplasm of breast: Secondary | ICD-10-CM | POA: Diagnosis not present

## 2020-01-14 ENCOUNTER — Encounter: Payer: Medicare HMO | Admitting: Physical Therapy

## 2020-01-22 ENCOUNTER — Other Ambulatory Visit: Payer: Self-pay

## 2020-01-22 ENCOUNTER — Ambulatory Visit: Payer: Medicare HMO | Attending: Specialist | Admitting: Physical Therapy

## 2020-01-22 ENCOUNTER — Encounter: Payer: Self-pay | Admitting: Physical Therapy

## 2020-01-22 DIAGNOSIS — M25512 Pain in left shoulder: Secondary | ICD-10-CM | POA: Diagnosis not present

## 2020-01-22 DIAGNOSIS — M545 Low back pain, unspecified: Secondary | ICD-10-CM

## 2020-01-22 DIAGNOSIS — R252 Cramp and spasm: Secondary | ICD-10-CM | POA: Diagnosis not present

## 2020-01-22 NOTE — Therapy (Signed)
[place], [place], [address] Phone: [phone]   Fax:  [phone]  Physical Therapy Treatment  Patient Details  Name: CIELO ARIAS MRN: [id number] Date of Birth: Jun 05, 1950 Referring Provider (PT): Beane   Encounter Date: 01/22/2020   PT End of Session - 01/22/20 0930    Visit Number 9    Date for PT Re-Evaluation 02/21/20    PT Start Time 0846    PT Stop Time 0931    PT Time Calculation (min) 45 min    Activity Tolerance Patient tolerated treatment well    Behavior During Therapy [facility] for tasks assessed/performed           Past Medical History:  Diagnosis Date   Arthritis    fingers   Cancer ([place]) 11/2016   right breast DCIS   Smoker    1/2-3/4 PPD    Past Surgical History:  Procedure Laterality Date   BLADDER SUSPENSION     BREAST LUMPECTOMY Right 11/2016   BREAST LUMPECTOMY WITH RADIOACTIVE SEED LOCALIZATION Right 12/18/2016   Procedure: BREAST LUMPECTOMY WITH RADIOACTIVE SEED LOCALIZATION;  Surgeon: Rolm Bookbinder, MD;  Location: [place];  Service: General;  Laterality: Right;    There were no vitals filed for this visit.   Subjective Assessment - 01/22/20 0918    Subjective Pt states that pain is back to where it was at start of PT; discussed putting on hold for 1-2 weeks while she goes back to doctor.    Currently in Pain? Yes    Pain Score 5     Pain Location Back                             [facility] Adult PT Treatment/Exercise - 01/22/20 0001      Lumbar Exercises: Stretches   Passive Hamstring Stretch Right;Left;4 reps;20 seconds    Single Knee to Chest Stretch Right;Left;4 reps;20 seconds    Piriformis Stretch Right;Left;4 reps;20 seconds    Other Lumbar Stretch Exercise hip adductor stretch x4, 20 sec hold      Lumbar Exercises: Aerobic   Nustep L5 x 7 min      Lumbar Exercises: Standing   Row  Limitations 20# 2x10    Other Standing Lumbar Exercises standing shoulder ext 5# 1x10      Lumbar Exercises: Seated   Sit to Stand 10 reps   with ball raise overhead   Other Seated Lumbar Exercises seated ball squeeze 2x10 with 3 sec hold                    PT Short Term Goals - 12/16/19 0851      PT SHORT TERM GOAL #1   Title independent with initial HEP    Time 2    Period Weeks    Status Achieved             PT Long Term Goals - 12/16/19 8546      PT LONG TERM GOAL #1   Title understand posture and body mechanics    Time 8    Period Weeks    Status Achieved      PT LONG TERM GOAL #2   Title decrease pain 50%    Time 8    Period Weeks    Status Partially Met      PT LONG TERM GOAL #3   Title  increase lumbar ROM 25%    Time 8    Period Weeks    Status Partially Met      PT LONG TERM GOAL #4   Title decrease pain 50%    Time 8    Period Weeks    Status Partially Met                 Plan - 01/22/20 0932    Clinical Impression Statement Pt reports LBP is back to baseline after worsening of symptoms; will put pt on hold for a few weeks and resume PT after follow up with MD. Pt required cuing for postural ex's. Education and correction on form for HEP ex's; pt will continue those at home over the next few weeks.    PT Treatment/Interventions ADLs/Self Care Home Management;Cryotherapy;Electrical Stimulation;Iontophoresis 38m/ml Dexamethasone;Moist Heat;Traction;Ultrasound;Therapeutic exercise;Therapeutic activities;Manual techniques;Patient/family education    PT Next Visit Plan core strength and flexibility, educate on posture and body mechanics    Consulted and Agree with Plan of Care Patient           Patient will benefit from skilled therapeutic intervention in order to improve the following deficits and impairments:  Decreased range of motion, Difficulty walking, Impaired UE functional use, Increased muscle spasms, Decreased activity  tolerance, Pain, Improper body mechanics, Impaired flexibility, Decreased strength, Postural dysfunction  Visit Diagnosis: Acute bilateral low back pain without sciatica  Acute pain of left shoulder  Cramp and spasm     Problem List Patient Active Problem List   Diagnosis Date Noted   Ductal carcinoma in situ (DCIS) of right breast 11/20/2016   Weakness 09/22/2016   AAmador Cunas PT, DPT ADonald ProseSugg 01/22/2020, 10:12 AM  [place] [address]Phone: [phone]  Fax:  [phone] Name: DSUAN PYEATTMRN: [id number] of Birth: 8Apr 06, 1952

## 2020-02-10 DIAGNOSIS — Z01419 Encounter for gynecological examination (general) (routine) without abnormal findings: Secondary | ICD-10-CM | POA: Diagnosis not present

## 2020-02-10 DIAGNOSIS — N819 Female genital prolapse, unspecified: Secondary | ICD-10-CM | POA: Diagnosis not present

## 2020-02-10 DIAGNOSIS — Z6826 Body mass index (BMI) 26.0-26.9, adult: Secondary | ICD-10-CM | POA: Diagnosis not present

## 2020-02-10 DIAGNOSIS — N952 Postmenopausal atrophic vaginitis: Secondary | ICD-10-CM | POA: Diagnosis not present

## 2020-02-10 DIAGNOSIS — N3281 Overactive bladder: Secondary | ICD-10-CM | POA: Diagnosis not present

## 2020-02-10 DIAGNOSIS — R32 Unspecified urinary incontinence: Secondary | ICD-10-CM | POA: Diagnosis not present

## 2020-02-11 ENCOUNTER — Ambulatory Visit: Payer: Medicare HMO | Admitting: Physical Therapy

## 2020-02-19 ENCOUNTER — Other Ambulatory Visit: Payer: Self-pay

## 2020-02-19 ENCOUNTER — Ambulatory Visit: Payer: Medicare HMO | Admitting: Physical Therapy

## 2020-02-19 ENCOUNTER — Encounter: Payer: Self-pay | Admitting: Physical Therapy

## 2020-02-19 DIAGNOSIS — R252 Cramp and spasm: Secondary | ICD-10-CM

## 2020-02-19 DIAGNOSIS — M25512 Pain in left shoulder: Secondary | ICD-10-CM | POA: Diagnosis not present

## 2020-02-19 DIAGNOSIS — M545 Low back pain, unspecified: Secondary | ICD-10-CM

## 2020-02-19 NOTE — Therapy (Signed)
[place], [place], [address] Phone: [phone]   Fax:  [phone]  Physical Therapy Treatment Progress Note Reporting Period 11/18/2019 to 02/19/2020  See note below for Objective Data and Assessment of Progress/Goals.      Patient Details  Name: Kristin Brennan MRN: [id number] Date of Birth: 03-26-1951 Referring Provider (PT): Beane   Encounter Date: 02/19/2020   PT End of Session - 02/19/20 0940    Visit Number 10    Date for PT Re-Evaluation 03/20/20    PT Start Time 0848    PT Stop Time 0932    PT Time Calculation (min) 44 min    Activity Tolerance Patient tolerated treatment well    Behavior During Therapy [facility] for tasks assessed/performed           Past Medical History:  Diagnosis Date  . Arthritis    fingers  . Cancer ([place]) 11/2016   right breast DCIS  . Smoker    1/2-3/4 PPD    Past Surgical History:  Procedure Laterality Date  . BLADDER SUSPENSION    . BREAST LUMPECTOMY Right 11/2016  . BREAST LUMPECTOMY WITH RADIOACTIVE SEED LOCALIZATION Right 12/18/2016   Procedure: BREAST LUMPECTOMY WITH RADIOACTIVE SEED LOCALIZATION;  Surgeon: Rolm Bookbinder, MD;  Location: [place];  Service: General;  Laterality: Right;    There were no vitals filed for this visit.   Subjective Assessment - 02/19/20 0852    Subjective Pt states that pain is about the same; maybe a little better overall but still having a lot of "bad days." Pt states that she would like to go back to the doctor prior to scheduling more visits.    Currently in Pain? No/denies    Pain Score 0-No pain    Pain Location Back    Pain Descriptors / Indicators Aching    Pain Radiating Towards still having pain in left thigh    Pain Frequency Intermittent              [facility] PT Assessment - 02/19/20 0001      AROM   Overall AROM Comments lumbar ROM decreased 25% some pain for side bending  and rotation (reports pinching pain); pain with function L shoulder IR otherwise [facility]      Flexibility   Soft Tissue Assessment /Muscle Length yes    Hamstrings 90 degree SLR mild pain reported    Piriformis tight                         [facility] Adult PT Treatment/Exercise - 02/19/20 0001      Self-Care   Self-Care Other Self-Care Comments    Other Self-Care Comments  Education on getting up/down off floor to help with gardening, updated HEP to include more LE strengthening ex's      Lumbar Exercises: Aerobic   Nustep L5 x 7 min      Lumbar Exercises: Machines for Strengthening   Cybex Knee Extension 10# 2x10 BLE    Cybex Knee Flexion 20# 2x10 BLE      Lumbar Exercises: Standing   Other Standing Lumbar Exercises getting up/down off floor with pad under knee and pushing up from table x5      Lumbar Exercises: Seated   Sit to Stand 10 reps   with focus on eccentric control     Lumbar Exercises: Supine   Pelvic Tilt 10 reps;5 seconds  Bridge Compliant;3 seconds;20 reps;Non-compliant   first set firm surface; 2nd set on foam pad                   PT Short Term Goals - 12/16/19 0851      PT SHORT TERM GOAL #1   Title independent with initial HEP    Time 2    Period Weeks    Status Achieved             PT Long Term Goals - 02/19/20 0913      PT LONG TERM GOAL #1   Title understand posture and body mechanics    Time 8    Period Weeks    Status Achieved      PT LONG TERM GOAL #2   Title decrease pain 50%    Baseline 40% less    Time 8    Period Weeks    Status Partially Met      PT LONG TERM GOAL #3   Title increase lumbar ROM 25%    Time 8    Period Weeks    Status Achieved      PT LONG TERM GOAL #4   Title decrease pain 50%    Time 8    Period Weeks    Status Partially Met                 Plan - 02/19/20 0940    Clinical Impression Statement Pt demos some progress towards goals. Lumbar AROM and shoulder AROM is  improved from time of eval. Still demos limitations in lumbar SB and rotation and L shoulder IR. Pt still demos some LE flexibility deficits. Functional LE weakness present with pt demo difficulty rising to stand from floor. Updated HEP to address these deficits with pt VU. Pt will plan to make a few more appts to address functional strengthening and schedule follow up appt with MD.    PT Treatment/Interventions ADLs/Self Care Home Management;Cryotherapy;Electrical Stimulation;Iontophoresis 73m/ml Dexamethasone;Moist Heat;Traction;Ultrasound;Therapeutic exercise;Therapeutic activities;Manual techniques;Patient/family education    PT Next Visit Plan core strength and flexibility, educate on posture and body mechanics    PT Home Exercise Plan wall quarter squat, hip abd/ext with red TB, STS with eccentric control    Consulted and Agree with Plan of Care Patient           Patient will benefit from skilled therapeutic intervention in order to improve the following deficits and impairments:  Decreased range of motion, Difficulty walking, Impaired UE functional use, Increased muscle spasms, Decreased activity tolerance, Pain, Improper body mechanics, Impaired flexibility, Decreased strength, Postural dysfunction  Visit Diagnosis: Acute bilateral low back pain without sciatica  Acute pain of left shoulder  Cramp and spasm     Problem List Patient Active Problem List   Diagnosis Date Noted  . Ductal carcinoma in situ (DCIS) of right breast 11/20/2016  . Weakness 09/22/2016   AAmador Cunas PT, DPT ADonald ProseSugg 02/19/2020, 9:45 AM  [place] [address]Phone: [phone]  Fax:  [phone] Name: DKAEDANCE MAGOSMRN: [id number] of Birth: [birth year]

## 2020-02-23 DIAGNOSIS — R69 Illness, unspecified: Secondary | ICD-10-CM | POA: Diagnosis not present

## 2020-03-20 ENCOUNTER — Ambulatory Visit: Payer: Medicare HMO | Attending: Internal Medicine

## 2020-03-20 DIAGNOSIS — Z23 Encounter for immunization: Secondary | ICD-10-CM

## 2020-03-20 NOTE — Progress Notes (Signed)
   Covid-19 Vaccination Clinic  Name:  Kristin Brennan    MRN: [id number] DOB: 1951/02/21  03/20/2020  Kristin Brennan was observed post Covid-19 immunization for 15 minutes without incident. She was provided with Vaccine Information Sheet and instruction to access the V-Safe system.   Kristin Brennan was instructed to call 911 with any severe reactions post vaccine: Marland Kitchen Difficulty breathing  . Swelling of face and throat  . A fast heartbeat  . A bad rash all over body  . Dizziness and weakness

## 2020-03-24 DIAGNOSIS — E7800 Pure hypercholesterolemia, unspecified: Secondary | ICD-10-CM | POA: Diagnosis not present

## 2020-03-24 DIAGNOSIS — I10 Essential (primary) hypertension: Secondary | ICD-10-CM | POA: Diagnosis not present

## 2020-03-24 DIAGNOSIS — Z0000 Encounter for general adult medical examination without abnormal findings: Secondary | ICD-10-CM | POA: Diagnosis not present

## 2020-03-24 DIAGNOSIS — E559 Vitamin D deficiency, unspecified: Secondary | ICD-10-CM | POA: Diagnosis not present

## 2020-03-31 DIAGNOSIS — I700 Atherosclerosis of aorta: Secondary | ICD-10-CM | POA: Diagnosis not present

## 2020-03-31 DIAGNOSIS — I10 Essential (primary) hypertension: Secondary | ICD-10-CM | POA: Diagnosis not present

## 2020-03-31 DIAGNOSIS — E7800 Pure hypercholesterolemia, unspecified: Secondary | ICD-10-CM | POA: Diagnosis not present

## 2020-03-31 DIAGNOSIS — N3946 Mixed incontinence: Secondary | ICD-10-CM | POA: Diagnosis not present

## 2020-03-31 DIAGNOSIS — R69 Illness, unspecified: Secondary | ICD-10-CM | POA: Diagnosis not present

## 2020-03-31 DIAGNOSIS — Z0000 Encounter for general adult medical examination without abnormal findings: Secondary | ICD-10-CM | POA: Diagnosis not present

## 2020-04-05 DIAGNOSIS — D2262 Melanocytic nevi of left upper limb, including shoulder: Secondary | ICD-10-CM | POA: Diagnosis not present

## 2020-04-05 DIAGNOSIS — D2261 Melanocytic nevi of right upper limb, including shoulder: Secondary | ICD-10-CM | POA: Diagnosis not present

## 2020-04-05 DIAGNOSIS — L812 Freckles: Secondary | ICD-10-CM | POA: Diagnosis not present

## 2020-04-05 DIAGNOSIS — Z85828 Personal history of other malignant neoplasm of skin: Secondary | ICD-10-CM | POA: Diagnosis not present

## 2020-04-05 DIAGNOSIS — D225 Melanocytic nevi of trunk: Secondary | ICD-10-CM | POA: Diagnosis not present

## 2020-04-05 DIAGNOSIS — D224 Melanocytic nevi of scalp and neck: Secondary | ICD-10-CM | POA: Diagnosis not present

## 2020-04-05 DIAGNOSIS — L821 Other seborrheic keratosis: Secondary | ICD-10-CM | POA: Diagnosis not present

## 2020-04-05 DIAGNOSIS — L570 Actinic keratosis: Secondary | ICD-10-CM | POA: Diagnosis not present

## 2020-07-16 ENCOUNTER — Other Ambulatory Visit: Payer: Self-pay | Admitting: Obstetrics and Gynecology

## 2020-07-16 ENCOUNTER — Other Ambulatory Visit: Payer: Self-pay

## 2020-07-16 ENCOUNTER — Ambulatory Visit
Admission: RE | Admit: 2020-07-16 | Discharge: 2020-07-16 | Disposition: A | Discharge status: Home / Self Care | Payer: Medicare HMO | Source: Ambulatory Visit | Attending: Obstetrics and Gynecology | Admitting: Obstetrics and Gynecology

## 2020-07-16 DIAGNOSIS — R921 Mammographic calcification found on diagnostic imaging of breast: Secondary | ICD-10-CM

## 2020-07-16 DIAGNOSIS — R922 Inconclusive mammogram: Secondary | ICD-10-CM | POA: Diagnosis not present

## 2020-07-16 DIAGNOSIS — Z853 Personal history of malignant neoplasm of breast: Secondary | ICD-10-CM | POA: Diagnosis not present

## 2020-09-01 DIAGNOSIS — H5213 Myopia, bilateral: Secondary | ICD-10-CM | POA: Diagnosis not present

## 2020-09-01 DIAGNOSIS — H2513 Age-related nuclear cataract, bilateral: Secondary | ICD-10-CM | POA: Diagnosis not present

## 2020-09-28 ENCOUNTER — Other Ambulatory Visit: Payer: Self-pay | Admitting: Internal Medicine

## 2020-09-28 DIAGNOSIS — R69 Illness, unspecified: Secondary | ICD-10-CM | POA: Diagnosis not present

## 2020-09-28 DIAGNOSIS — I700 Atherosclerosis of aorta: Secondary | ICD-10-CM

## 2020-09-28 DIAGNOSIS — I10 Essential (primary) hypertension: Secondary | ICD-10-CM | POA: Diagnosis not present

## 2020-09-28 DIAGNOSIS — E7800 Pure hypercholesterolemia, unspecified: Secondary | ICD-10-CM | POA: Diagnosis not present

## 2020-10-14 ENCOUNTER — Other Ambulatory Visit: Payer: Self-pay | Admitting: Internal Medicine

## 2020-10-14 DIAGNOSIS — F17200 Nicotine dependence, unspecified, uncomplicated: Secondary | ICD-10-CM

## 2020-11-05 IMAGING — CT CT CHEST LUNG CANCER SCREENING LOW DOSE W/O CM
1 of 2 series · 10 of 20 positions shown, 13 images · non-contrast
Comparison: None

CLINICAL DATA: Current smoker. Forty pack-year history.
Asymptomatic.

EXAM:
CT CHEST WITHOUT CONTRAST LOW-DOSE FOR LUNG CANCER SCREENING
TECHNIQUE: Multidetector CT imaging of the chest was performed following the
standard protocol without IV contrast.

[ct lung segmentation data · axial · 0.61mm/px · z∈[+547,+547]mm · 10 of 329 frames shown]
[frame 1/329  mediastinal]
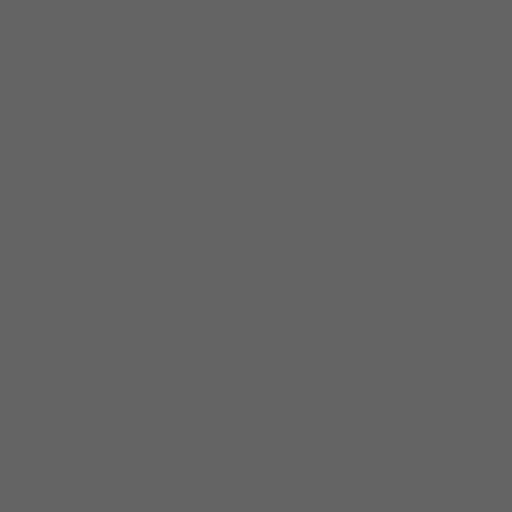
[frame 1/329  lung]
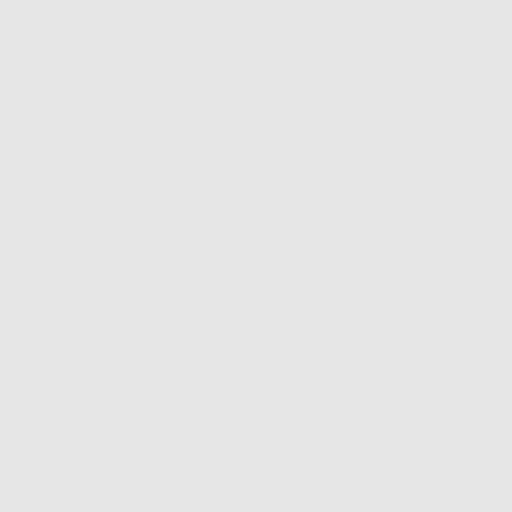
[frame 37/329  lung]
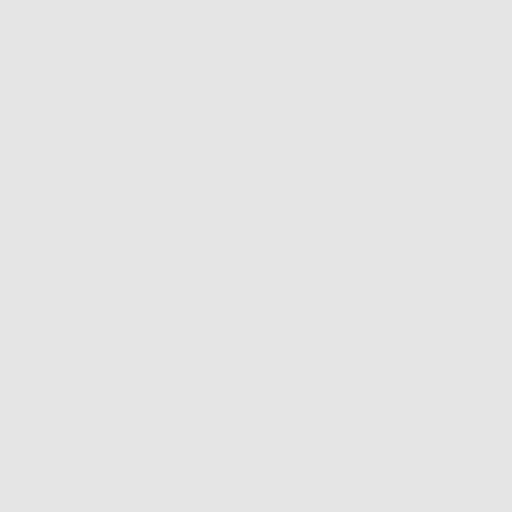
[frame 73/329  lung]
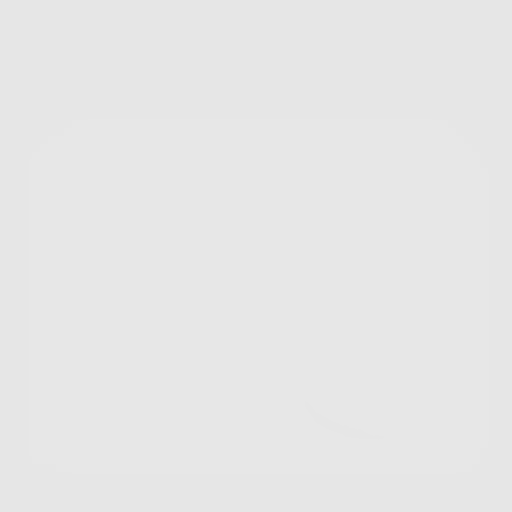
[frame 110/329  lung]
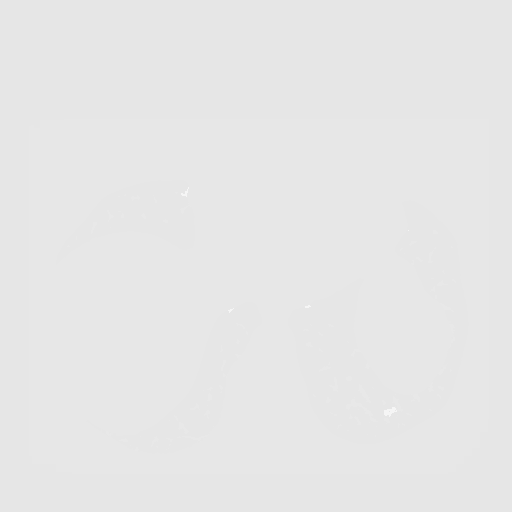
[frame 146/329  mediastinal]
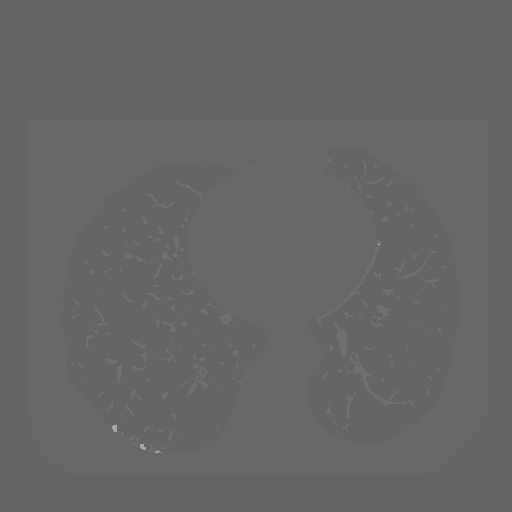
[frame 146/329  lung]
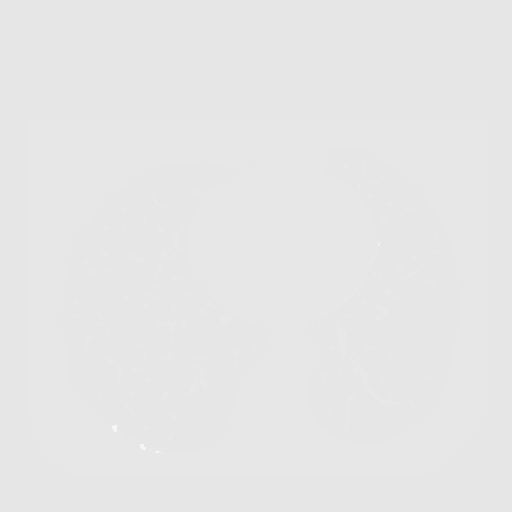
[frame 183/329  lung]
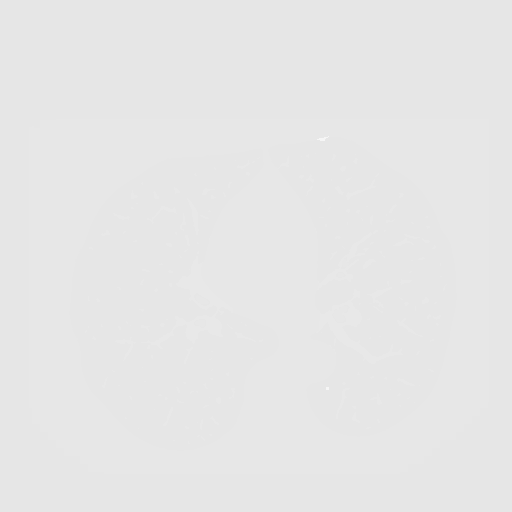
[frame 219/329  lung]
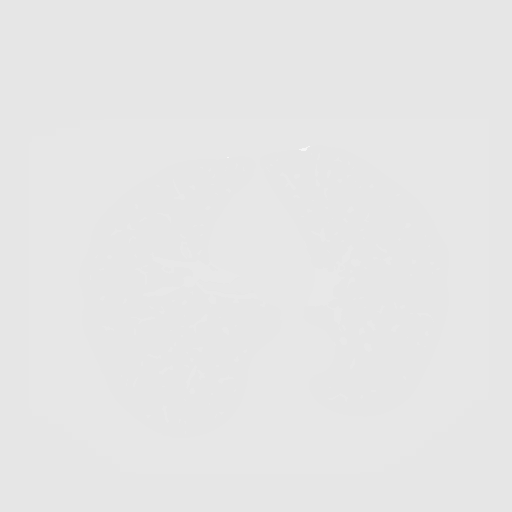
[frame 256/329  lung]
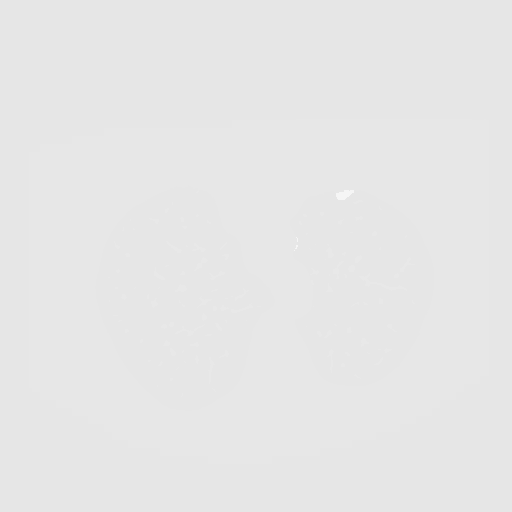
[frame 292/329  mediastinal]
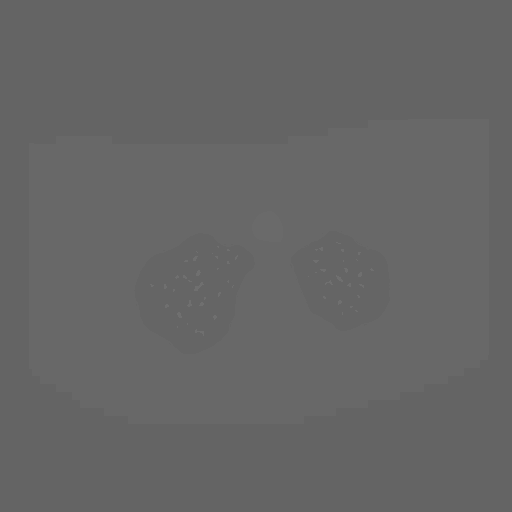
[frame 292/329  lung]
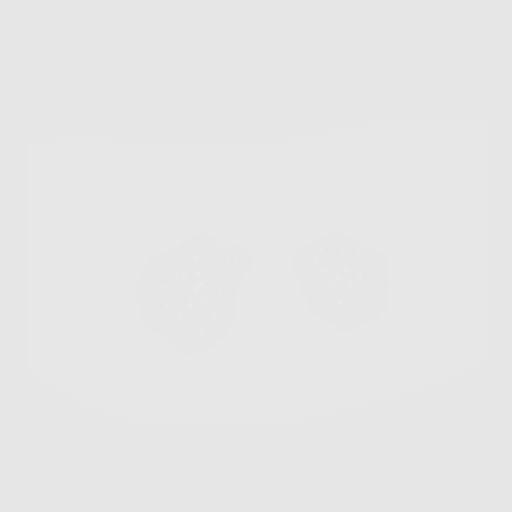
[frame 329/329  lung]
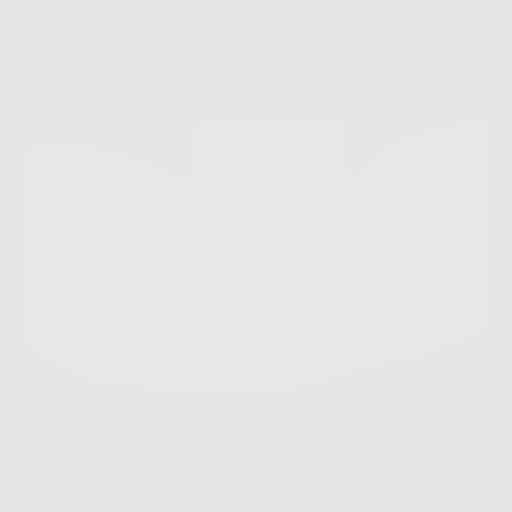

[10 of 20 positions shown; findings below may reference images not displayed]

FINDINGS: Cardiovascular: Normal heart size. Aortic atherosclerosis. No
pericardial effusion.

Mediastinum/Nodes: Normal appearance of the thyroid gland. The
trachea appears patent and is midline. Normal appearance of the
esophagus. No enlarged mediastinal or hilar lymph nodes.

Lungs/Pleura: No pleural effusion, airspace consolidation or
atelectasis. Multiple scratch set several small pulmonary nodules
are identified. The largest lung nodule is in the subpleural
posterior left lower lobe with equivalent diameter of 4.2 mm.

Upper Abdomen: Small low-attenuation nodule in the left adrenal
gland is unchanged and is favored to represent a benign adenoma
measuring 0.8 cm, image 58/2.

Musculoskeletal: No chest wall mass or suspicious bone lesions
identified.
IMPRESSION: 1. Lung-RADS 2, benign appearance or behavior. Continue annual
screening with low-dose chest CT without contrast in 12 months.
2. Aortic atherosclerosis.

Aortic Atherosclerosis ([id number]-HJ7.7).

## 2021-01-14 ENCOUNTER — Other Ambulatory Visit: Payer: Self-pay

## 2021-01-14 ENCOUNTER — Ambulatory Visit
Admission: RE | Admit: 2021-01-14 | Discharge: 2021-01-14 | Disposition: A | Discharge status: Home / Self Care | Payer: Medicare HMO | Source: Ambulatory Visit | Attending: Obstetrics and Gynecology | Admitting: Obstetrics and Gynecology

## 2021-01-14 DIAGNOSIS — R922 Inconclusive mammogram: Secondary | ICD-10-CM | POA: Diagnosis not present

## 2021-01-14 DIAGNOSIS — Z853 Personal history of malignant neoplasm of breast: Secondary | ICD-10-CM | POA: Diagnosis not present

## 2021-01-14 DIAGNOSIS — R921 Mammographic calcification found on diagnostic imaging of breast: Secondary | ICD-10-CM

## 2021-03-28 DIAGNOSIS — Z0000 Encounter for general adult medical examination without abnormal findings: Secondary | ICD-10-CM | POA: Diagnosis not present

## 2021-03-28 DIAGNOSIS — E559 Vitamin D deficiency, unspecified: Secondary | ICD-10-CM | POA: Diagnosis not present

## 2021-03-28 DIAGNOSIS — I10 Essential (primary) hypertension: Secondary | ICD-10-CM | POA: Diagnosis not present

## 2021-03-28 DIAGNOSIS — E7800 Pure hypercholesterolemia, unspecified: Secondary | ICD-10-CM | POA: Diagnosis not present

## 2021-03-29 DIAGNOSIS — Z01419 Encounter for gynecological examination (general) (routine) without abnormal findings: Secondary | ICD-10-CM | POA: Diagnosis not present

## 2021-03-29 DIAGNOSIS — Z124 Encounter for screening for malignant neoplasm of cervix: Secondary | ICD-10-CM | POA: Diagnosis not present

## 2021-03-29 DIAGNOSIS — N3281 Overactive bladder: Secondary | ICD-10-CM | POA: Diagnosis not present

## 2021-03-29 DIAGNOSIS — Z6826 Body mass index (BMI) 26.0-26.9, adult: Secondary | ICD-10-CM | POA: Diagnosis not present

## 2021-03-29 DIAGNOSIS — N952 Postmenopausal atrophic vaginitis: Secondary | ICD-10-CM | POA: Diagnosis not present

## 2021-04-01 DIAGNOSIS — H5711 Ocular pain, right eye: Secondary | ICD-10-CM | POA: Diagnosis not present

## 2021-04-05 DIAGNOSIS — D2272 Melanocytic nevi of left lower limb, including hip: Secondary | ICD-10-CM | POA: Diagnosis not present

## 2021-04-05 DIAGNOSIS — D2271 Melanocytic nevi of right lower limb, including hip: Secondary | ICD-10-CM | POA: Diagnosis not present

## 2021-04-05 DIAGNOSIS — D2239 Melanocytic nevi of other parts of face: Secondary | ICD-10-CM | POA: Diagnosis not present

## 2021-04-05 DIAGNOSIS — D225 Melanocytic nevi of trunk: Secondary | ICD-10-CM | POA: Diagnosis not present

## 2021-04-05 DIAGNOSIS — K130 Diseases of lips: Secondary | ICD-10-CM | POA: Diagnosis not present

## 2021-04-05 DIAGNOSIS — D485 Neoplasm of uncertain behavior of skin: Secondary | ICD-10-CM | POA: Diagnosis not present

## 2021-04-05 DIAGNOSIS — L814 Other melanin hyperpigmentation: Secondary | ICD-10-CM | POA: Diagnosis not present

## 2021-04-05 DIAGNOSIS — D2339 Other benign neoplasm of skin of other parts of face: Secondary | ICD-10-CM | POA: Diagnosis not present

## 2021-04-05 DIAGNOSIS — Z85828 Personal history of other malignant neoplasm of skin: Secondary | ICD-10-CM | POA: Diagnosis not present

## 2021-04-06 DIAGNOSIS — I700 Atherosclerosis of aorta: Secondary | ICD-10-CM | POA: Diagnosis not present

## 2021-04-06 DIAGNOSIS — I10 Essential (primary) hypertension: Secondary | ICD-10-CM | POA: Diagnosis not present

## 2021-04-06 DIAGNOSIS — Z0000 Encounter for general adult medical examination without abnormal findings: Secondary | ICD-10-CM | POA: Diagnosis not present

## 2021-04-06 DIAGNOSIS — R69 Illness, unspecified: Secondary | ICD-10-CM | POA: Diagnosis not present

## 2021-04-19 ENCOUNTER — Ambulatory Visit
Admission: RE | Admit: 2021-04-19 | Discharge: 2021-04-19 | Disposition: A | Discharge status: Home / Self Care | Payer: No Typology Code available for payment source | Source: Ambulatory Visit | Attending: Internal Medicine | Admitting: Internal Medicine

## 2021-04-19 ENCOUNTER — Ambulatory Visit
Admission: RE | Admit: 2021-04-19 | Discharge: 2021-04-19 | Disposition: A | Discharge status: Home / Self Care | Payer: Medicare HMO | Source: Ambulatory Visit | Attending: Internal Medicine | Admitting: Internal Medicine

## 2021-04-19 DIAGNOSIS — F17200 Nicotine dependence, unspecified, uncomplicated: Secondary | ICD-10-CM

## 2021-04-19 DIAGNOSIS — R69 Illness, unspecified: Secondary | ICD-10-CM | POA: Diagnosis not present

## 2021-04-19 DIAGNOSIS — F17210 Nicotine dependence, cigarettes, uncomplicated: Secondary | ICD-10-CM | POA: Diagnosis not present

## 2021-04-19 DIAGNOSIS — I700 Atherosclerosis of aorta: Secondary | ICD-10-CM

## 2021-05-05 DIAGNOSIS — Z1159 Encounter for screening for other viral diseases: Secondary | ICD-10-CM | POA: Diagnosis not present

## 2021-05-05 DIAGNOSIS — E7800 Pure hypercholesterolemia, unspecified: Secondary | ICD-10-CM | POA: Diagnosis not present

## 2021-05-09 DIAGNOSIS — R69 Illness, unspecified: Secondary | ICD-10-CM | POA: Diagnosis not present

## 2021-05-09 DIAGNOSIS — I2510 Atherosclerotic heart disease of native coronary artery without angina pectoris: Secondary | ICD-10-CM | POA: Diagnosis not present

## 2021-05-09 DIAGNOSIS — I2584 Coronary atherosclerosis due to calcified coronary lesion: Secondary | ICD-10-CM | POA: Diagnosis not present

## 2021-05-09 DIAGNOSIS — L658 Other specified nonscarring hair loss: Secondary | ICD-10-CM | POA: Diagnosis not present

## 2021-05-09 DIAGNOSIS — E7800 Pure hypercholesterolemia, unspecified: Secondary | ICD-10-CM | POA: Diagnosis not present

## 2021-05-22 HISTORY — PX: BREAST BIOPSY: SHX20

## 2021-06-22 DIAGNOSIS — E7800 Pure hypercholesterolemia, unspecified: Secondary | ICD-10-CM | POA: Diagnosis not present

## 2021-06-29 DIAGNOSIS — R300 Dysuria: Secondary | ICD-10-CM | POA: Diagnosis not present

## 2021-06-29 DIAGNOSIS — I10 Essential (primary) hypertension: Secondary | ICD-10-CM | POA: Diagnosis not present

## 2021-07-07 DIAGNOSIS — L650 Telogen effluvium: Secondary | ICD-10-CM | POA: Diagnosis not present

## 2021-07-07 DIAGNOSIS — Z79899 Other long term (current) drug therapy: Secondary | ICD-10-CM | POA: Diagnosis not present

## 2021-09-01 DIAGNOSIS — M8588 Other specified disorders of bone density and structure, other site: Secondary | ICD-10-CM | POA: Diagnosis not present

## 2021-09-06 DIAGNOSIS — H5213 Myopia, bilateral: Secondary | ICD-10-CM | POA: Diagnosis not present

## 2021-09-06 DIAGNOSIS — H2513 Age-related nuclear cataract, bilateral: Secondary | ICD-10-CM | POA: Diagnosis not present

## 2021-09-06 DIAGNOSIS — H35363 Drusen (degenerative) of macula, bilateral: Secondary | ICD-10-CM | POA: Diagnosis not present

## 2021-12-20 ENCOUNTER — Other Ambulatory Visit: Payer: Self-pay | Admitting: Obstetrics and Gynecology

## 2021-12-20 DIAGNOSIS — R921 Mammographic calcification found on diagnostic imaging of breast: Secondary | ICD-10-CM

## 2022-01-18 ENCOUNTER — Other Ambulatory Visit: Payer: Self-pay | Admitting: Obstetrics and Gynecology

## 2022-01-18 ENCOUNTER — Ambulatory Visit
Admission: RE | Admit: 2022-01-18 | Discharge: 2022-01-18 | Disposition: A | Discharge status: Home / Self Care | Payer: Medicare HMO | Source: Ambulatory Visit | Attending: Obstetrics and Gynecology | Admitting: Obstetrics and Gynecology

## 2022-01-18 DIAGNOSIS — Z853 Personal history of malignant neoplasm of breast: Secondary | ICD-10-CM | POA: Diagnosis not present

## 2022-01-18 DIAGNOSIS — R921 Mammographic calcification found on diagnostic imaging of breast: Secondary | ICD-10-CM

## 2022-02-06 ENCOUNTER — Inpatient Hospital Stay: Admission: RE | Admit: 2022-02-06 | Payer: Medicare HMO | Source: Ambulatory Visit

## 2022-02-20 ENCOUNTER — Other Ambulatory Visit: Payer: Self-pay | Admitting: Obstetrics and Gynecology

## 2022-02-20 ENCOUNTER — Ambulatory Visit
Admission: RE | Admit: 2022-02-20 | Discharge: 2022-02-20 | Disposition: A | Discharge status: Home / Self Care | Payer: Medicare HMO | Source: Ambulatory Visit | Attending: Obstetrics and Gynecology | Admitting: Obstetrics and Gynecology

## 2022-02-20 DIAGNOSIS — R921 Mammographic calcification found on diagnostic imaging of breast: Secondary | ICD-10-CM

## 2022-02-20 DIAGNOSIS — N6011 Diffuse cystic mastopathy of right breast: Secondary | ICD-10-CM | POA: Diagnosis not present

## 2022-04-05 DIAGNOSIS — I10 Essential (primary) hypertension: Secondary | ICD-10-CM | POA: Diagnosis not present

## 2022-04-10 ENCOUNTER — Other Ambulatory Visit: Payer: Self-pay | Admitting: Internal Medicine

## 2022-04-10 ENCOUNTER — Telehealth: Payer: Self-pay | Admitting: Hematology and Oncology

## 2022-04-10 DIAGNOSIS — Z86010 Personal history of colonic polyps: Secondary | ICD-10-CM | POA: Diagnosis not present

## 2022-04-10 DIAGNOSIS — F17200 Nicotine dependence, unspecified, uncomplicated: Secondary | ICD-10-CM

## 2022-04-10 DIAGNOSIS — R69 Illness, unspecified: Secondary | ICD-10-CM | POA: Diagnosis not present

## 2022-04-10 DIAGNOSIS — I10 Essential (primary) hypertension: Secondary | ICD-10-CM | POA: Diagnosis not present

## 2022-04-10 DIAGNOSIS — I700 Atherosclerosis of aorta: Secondary | ICD-10-CM | POA: Diagnosis not present

## 2022-04-10 DIAGNOSIS — N3946 Mixed incontinence: Secondary | ICD-10-CM | POA: Diagnosis not present

## 2022-04-10 DIAGNOSIS — I2510 Atherosclerotic heart disease of native coronary artery without angina pectoris: Secondary | ICD-10-CM | POA: Diagnosis not present

## 2022-04-10 DIAGNOSIS — Z9189 Other specified personal risk factors, not elsewhere classified: Secondary | ICD-10-CM | POA: Diagnosis not present

## 2022-04-10 DIAGNOSIS — Z0000 Encounter for general adult medical examination without abnormal findings: Secondary | ICD-10-CM | POA: Diagnosis not present

## 2022-04-10 DIAGNOSIS — M5432 Sciatica, left side: Secondary | ICD-10-CM | POA: Diagnosis not present

## 2022-04-10 DIAGNOSIS — J439 Emphysema, unspecified: Secondary | ICD-10-CM | POA: Diagnosis not present

## 2022-04-10 NOTE — Telephone Encounter (Signed)
Contacted patient to scheduled appointments. Left message with appointment details and a call back number if patient had any questions or could not accommodate the time we provided.   

## 2022-04-19 ENCOUNTER — Encounter: Payer: Self-pay | Admitting: Family Medicine

## 2022-04-22 NOTE — Progress Notes (Signed)
Patient Care Team: Deland Pretty, MD as PCP - General (Internal Medicine) Nicholas Lose, MD as Consulting Physician (Hematology and Oncology) Delice Bison Charlestine Massed, NP as Nurse Practitioner (Hematology and Oncology) Rolm Bookbinder, MD as Consulting Physician (General Surgery)  DIAGNOSIS:  Encounter Diagnosis  Name Primary?   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  Because of this Kristin Brennan did not need radiation and chemotherapy.     CHIEF COMPLIANT: Increased risk of breast cancer  INTERVAL HISTORY: Kristin Brennan is a 71 y.o with the above-mentioned increased risk of breast cancer. Kristin Brennan presents to the clinic for  a follow-up.  Kristin Brennan had a mammogram at the end of August and had to undergo biopsies which were benign.  Kristin Brennan denies any pain or discomfort in the breast.  Kristin Brennan wanted to come back to [place] and discussed appropriate surveillance for Kristin Brennan breast.    ALLERGIES:  is allergic to codeine.  MEDICATIONS:  Current Outpatient Medications  Medication Sig Dispense Refill   amLODipine (NORVASC) 5 MG tablet Take by mouth.     AREXVY 120 MCG/0.5ML injection 0.5 mLs.     COMIRNATY syringe Inject 0.3 mLs into the muscle.     FLUZONE HIGH-DOSE QUADRIVALENT 0.7 ML SUSY      Influenza vac split quadrivalent PF (FLUZONE HIGH-DOSE) 0.5 ML injection 0.5 mLs.     losartan-hydrochlorothiazide (HYZAAR) 100-25 MG tablet Take  1 tablet by mouth daily.     rosuvastatin (CRESTOR) 20 MG tablet Take by mouth daily.     No current facility-administered medications for this visit.    PHYSICAL EXAMINATION: ECOG PERFORMANCE STATUS: 0 - Asymptomatic  Vitals:   04/28/22 1145  BP: (!) 142/77  Pulse: 72  Resp: 13  Temp: (!) 97.2 F (36.2 C)  SpO2: 99%   Filed Weights   04/28/22 1145  Weight: 155 lb 8 oz (70.5 kg)      LABORATORY DATA:  I have reviewed the data as listed     No data to display          Lab Results  Component Value Date   WBC 8.0 07/26/2009   HGB 14.3 07/26/2009   HCT 42.0 07/26/2009   MCV 94.9 07/26/2009   PLT 262 07/26/2009    ASSESSMENT & PLAN:  Ductal carcinoma in situ (DCIS) of right breast 10/23/2016: Screening mammogram at [facility] revealed 3 x 5 x 7 mm calcifications upper outer quadrant right breast;  stereotactic biopsy revealed DCIS with calcifications, intermediate grade, ER 0%, PR 0%, Tis Nx stage 0   12/18/2016: Right lumpectomy: LCIS with calcifications and focal pleomorphic features, margins negative, right additional medial margin ALH.   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  Breast cancer surveillance: Breast exam 04/28/2022: Benign Mammogram 01/18/2022: Indeterminate calcifications right breast  6 mm and a second group of calcifications stable stereotactic biopsy: Benign fibrocystic change Recommend annual breast MRI given lifetime risk of breast cancer greater than 25%.  Will obtain a breast MRI in March 2024.  Our plan is to obtain breast MRIs every 3 to 4 years.  After the next 2 MRIs Kristin Brennan probably will not need it any further.   Return to clinic in 1 year for follow-up.  After that we could probably see Kristin Brennan on an as-needed basis.   Orders Placed This Encounter  Procedures   MM DIAG  BREAST TOMO BILATERAL    Standing Status:   Future    Standing Expiration Date:   04/29/2023    Order Specific Question:   Reason for Exam (SYMPTOM  OR DIAGNOSIS REQUIRED)    Answer:   annual mamaograms    Order Specific Question:   Preferred imaging location?    Answer:   [place] [facility]    25% life time risk    Standing Status:   Future    Standing Expiration Date:   04/29/2023    Order Specific Question:   If indicated for the ordered procedure, I authorize the administration of contrast media per Radiology protocol    Answer:   Yes    Order Specific Question:   What is the patient's sedation requirement?    Answer:   No Sedation    Order Specific Question:   Does the patient have a pacemaker or implanted devices?    Answer:   No    Order Specific Question:   Preferred imaging location?    Answer:   [facility] (table limit-550lbs)   The patient has a good understanding of the overall plan. Kristin Brennan agrees with it. Kristin Brennan will call with any problems that may develop before the next visit here. Total time spent: 30 mins including face to face time and time spent for planning, charting and co-ordination of care   Harriette Ohara, MD 04/28/22    I Gardiner Coins am scribing for Dr. Lindi Adie  I have reviewed the above documentation for accuracy and completeness, and I agree with the above.

## 2022-04-28 ENCOUNTER — Inpatient Hospital Stay: Payer: Medicare HMO | Attending: Hematology and Oncology | Admitting: Hematology and Oncology

## 2022-04-28 ENCOUNTER — Other Ambulatory Visit: Payer: Self-pay

## 2022-04-28 VITALS — BP 142/77 | HR 72 | Temp 97.2°F | Resp 13 | Wt 155.5 lb

## 2022-04-28 DIAGNOSIS — Z885 Allergy status to narcotic agent status: Secondary | ICD-10-CM | POA: Insufficient documentation

## 2022-04-28 DIAGNOSIS — Z170 Estrogen receptor positive status [ER+]: Secondary | ICD-10-CM | POA: Insufficient documentation

## 2022-04-28 DIAGNOSIS — Z79899 Other long term (current) drug therapy: Secondary | ICD-10-CM | POA: Diagnosis not present

## 2022-04-28 DIAGNOSIS — D0511 Intraductal carcinoma in situ of right breast: Secondary | ICD-10-CM

## 2022-04-28 NOTE — Assessment & Plan Note (Signed)
10/23/2016: Screening mammogram at [facility] revealed 3 x 5 x 7 mm calcifications upper outer quadrant right breast;  stereotactic biopsy revealed DCIS with calcifications, intermediate grade, ER 0%, PR 0%, Tis Nx stage 0   12/18/2016: Right lumpectomy: LCIS with calcifications and focal pleomorphic features, margins negative, right additional medial margin ALH.   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  Breast cancer surveillance: Breast exam 04/28/2022: Benign Mammogram 01/18/2022: Indeterminate calcifications right breast 6 mm and a second group of calcifications stable stereotactic biopsy: Benign fibrocystic change Recommend annual breast MRI given lifetime risk of breast cancer greater than 25% Return to clinic in October for follow-up

## 2022-05-04 DIAGNOSIS — D0510 Intraductal carcinoma in situ of unspecified breast: Secondary | ICD-10-CM | POA: Diagnosis not present

## 2022-05-04 DIAGNOSIS — N952 Postmenopausal atrophic vaginitis: Secondary | ICD-10-CM | POA: Diagnosis not present

## 2022-05-04 DIAGNOSIS — Z809 Family history of malignant neoplasm, unspecified: Secondary | ICD-10-CM | POA: Diagnosis not present

## 2022-05-04 DIAGNOSIS — Z6826 Body mass index (BMI) 26.0-26.9, adult: Secondary | ICD-10-CM | POA: Diagnosis not present

## 2022-05-04 DIAGNOSIS — Z01419 Encounter for gynecological examination (general) (routine) without abnormal findings: Secondary | ICD-10-CM | POA: Diagnosis not present

## 2022-06-01 ENCOUNTER — Other Ambulatory Visit: Payer: Medicare HMO

## 2022-06-15 DIAGNOSIS — E755 Other lipid storage disorders: Secondary | ICD-10-CM | POA: Diagnosis not present

## 2022-06-19 ENCOUNTER — Encounter: Payer: Self-pay | Admitting: Internal Medicine

## 2022-07-11 DIAGNOSIS — Z85828 Personal history of other malignant neoplasm of skin: Secondary | ICD-10-CM | POA: Diagnosis not present

## 2022-07-11 DIAGNOSIS — L308 Other specified dermatitis: Secondary | ICD-10-CM | POA: Diagnosis not present

## 2022-07-11 DIAGNOSIS — L812 Freckles: Secondary | ICD-10-CM | POA: Diagnosis not present

## 2022-07-11 DIAGNOSIS — D225 Melanocytic nevi of trunk: Secondary | ICD-10-CM | POA: Diagnosis not present

## 2022-07-11 DIAGNOSIS — L853 Xerosis cutis: Secondary | ICD-10-CM | POA: Diagnosis not present

## 2022-07-11 DIAGNOSIS — L570 Actinic keratosis: Secondary | ICD-10-CM | POA: Diagnosis not present

## 2022-07-11 DIAGNOSIS — D485 Neoplasm of uncertain behavior of skin: Secondary | ICD-10-CM | POA: Diagnosis not present

## 2022-07-11 DIAGNOSIS — L821 Other seborrheic keratosis: Secondary | ICD-10-CM | POA: Diagnosis not present

## 2022-07-25 ENCOUNTER — Ambulatory Visit
Admission: RE | Admit: 2022-07-25 | Discharge: 2022-07-25 | Disposition: A | Discharge status: Home / Self Care | Payer: Medicare HMO | Source: Ambulatory Visit | Attending: Internal Medicine | Admitting: Internal Medicine

## 2022-07-25 DIAGNOSIS — F17210 Nicotine dependence, cigarettes, uncomplicated: Secondary | ICD-10-CM | POA: Diagnosis not present

## 2022-07-25 DIAGNOSIS — F17200 Nicotine dependence, unspecified, uncomplicated: Secondary | ICD-10-CM

## 2022-07-25 DIAGNOSIS — R69 Illness, unspecified: Secondary | ICD-10-CM | POA: Diagnosis not present

## 2022-09-19 DIAGNOSIS — H2513 Age-related nuclear cataract, bilateral: Secondary | ICD-10-CM | POA: Diagnosis not present

## 2022-09-19 DIAGNOSIS — H5213 Myopia, bilateral: Secondary | ICD-10-CM | POA: Diagnosis not present

## 2022-09-19 DIAGNOSIS — H353132 Nonexudative age-related macular degeneration, bilateral, intermediate dry stage: Secondary | ICD-10-CM | POA: Diagnosis not present

## 2022-09-20 DIAGNOSIS — Z1211 Encounter for screening for malignant neoplasm of colon: Secondary | ICD-10-CM | POA: Diagnosis not present

## 2022-09-20 DIAGNOSIS — I10 Essential (primary) hypertension: Secondary | ICD-10-CM | POA: Diagnosis not present

## 2022-10-10 DIAGNOSIS — Z86010 Personal history of colonic polyps: Secondary | ICD-10-CM | POA: Diagnosis not present

## 2022-10-10 DIAGNOSIS — K5730 Diverticulosis of large intestine without perforation or abscess without bleeding: Secondary | ICD-10-CM | POA: Diagnosis not present

## 2023-01-24 ENCOUNTER — Ambulatory Visit
Admission: RE | Admit: 2023-01-24 | Discharge: 2023-01-24 | Disposition: A | Discharge status: Home / Self Care | Payer: Medicare HMO | Source: Ambulatory Visit | Attending: Hematology and Oncology | Admitting: Hematology and Oncology

## 2023-01-24 DIAGNOSIS — D0511 Intraductal carcinoma in situ of right breast: Secondary | ICD-10-CM

## 2023-01-24 DIAGNOSIS — Z1231 Encounter for screening mammogram for malignant neoplasm of breast: Secondary | ICD-10-CM | POA: Diagnosis not present

## 2023-03-16 DIAGNOSIS — S61211A Laceration without foreign body of left index finger without damage to nail, initial encounter: Secondary | ICD-10-CM | POA: Diagnosis not present

## 2023-04-04 DIAGNOSIS — H5213 Myopia, bilateral: Secondary | ICD-10-CM | POA: Diagnosis not present

## 2023-04-04 DIAGNOSIS — H2513 Age-related nuclear cataract, bilateral: Secondary | ICD-10-CM | POA: Diagnosis not present

## 2023-04-24 DIAGNOSIS — E559 Vitamin D deficiency, unspecified: Secondary | ICD-10-CM | POA: Diagnosis not present

## 2023-04-24 DIAGNOSIS — E7800 Pure hypercholesterolemia, unspecified: Secondary | ICD-10-CM | POA: Diagnosis not present

## 2023-04-24 DIAGNOSIS — I10 Essential (primary) hypertension: Secondary | ICD-10-CM | POA: Diagnosis not present

## 2023-04-25 ENCOUNTER — Other Ambulatory Visit: Payer: Self-pay | Admitting: *Deleted

## 2023-04-25 DIAGNOSIS — D0511 Intraductal carcinoma in situ of right breast: Secondary | ICD-10-CM

## 2023-04-26 DIAGNOSIS — Z0000 Encounter for general adult medical examination without abnormal findings: Secondary | ICD-10-CM | POA: Diagnosis not present

## 2023-04-26 DIAGNOSIS — M25552 Pain in left hip: Secondary | ICD-10-CM | POA: Diagnosis not present

## 2023-04-26 DIAGNOSIS — R490 Dysphonia: Secondary | ICD-10-CM | POA: Diagnosis not present

## 2023-04-26 DIAGNOSIS — I2510 Atherosclerotic heart disease of native coronary artery without angina pectoris: Secondary | ICD-10-CM | POA: Diagnosis not present

## 2023-04-26 DIAGNOSIS — I10 Essential (primary) hypertension: Secondary | ICD-10-CM | POA: Diagnosis not present

## 2023-04-26 DIAGNOSIS — R053 Chronic cough: Secondary | ICD-10-CM | POA: Diagnosis not present

## 2023-04-26 DIAGNOSIS — F17200 Nicotine dependence, unspecified, uncomplicated: Secondary | ICD-10-CM | POA: Diagnosis not present

## 2023-04-26 DIAGNOSIS — J439 Emphysema, unspecified: Secondary | ICD-10-CM | POA: Diagnosis not present

## 2023-04-26 DIAGNOSIS — E559 Vitamin D deficiency, unspecified: Secondary | ICD-10-CM | POA: Diagnosis not present

## 2023-04-26 DIAGNOSIS — I700 Atherosclerosis of aorta: Secondary | ICD-10-CM | POA: Diagnosis not present

## 2023-04-26 DIAGNOSIS — F17210 Nicotine dependence, cigarettes, uncomplicated: Secondary | ICD-10-CM | POA: Diagnosis not present

## 2023-05-01 ENCOUNTER — Other Ambulatory Visit: Payer: Self-pay | Admitting: Internal Medicine

## 2023-05-01 ENCOUNTER — Encounter (INDEPENDENT_AMBULATORY_CARE_PROVIDER_SITE_OTHER): Payer: Self-pay | Admitting: Otolaryngology

## 2023-05-01 ENCOUNTER — Inpatient Hospital Stay: Payer: Medicare HMO | Admitting: Hematology and Oncology

## 2023-05-01 DIAGNOSIS — F17210 Nicotine dependence, cigarettes, uncomplicated: Secondary | ICD-10-CM

## 2023-05-04 ENCOUNTER — Encounter: Payer: Self-pay | Admitting: Internal Medicine

## 2023-06-28 DIAGNOSIS — Z6826 Body mass index (BMI) 26.0-26.9, adult: Secondary | ICD-10-CM | POA: Diagnosis not present

## 2023-06-28 DIAGNOSIS — N952 Postmenopausal atrophic vaginitis: Secondary | ICD-10-CM | POA: Diagnosis not present

## 2023-06-28 DIAGNOSIS — N393 Stress incontinence (female) (male): Secondary | ICD-10-CM | POA: Diagnosis not present

## 2023-06-28 DIAGNOSIS — M8580 Other specified disorders of bone density and structure, unspecified site: Secondary | ICD-10-CM | POA: Diagnosis not present

## 2023-06-28 DIAGNOSIS — Z01419 Encounter for gynecological examination (general) (routine) without abnormal findings: Secondary | ICD-10-CM | POA: Diagnosis not present

## 2023-07-05 ENCOUNTER — Encounter: Payer: Self-pay | Admitting: Internal Medicine

## 2023-07-20 ENCOUNTER — Encounter: Payer: Self-pay | Admitting: Hematology and Oncology

## 2023-07-21 DIAGNOSIS — C50911 Malignant neoplasm of unspecified site of right female breast: Secondary | ICD-10-CM

## 2023-07-21 HISTORY — DX: Malignant neoplasm of unspecified site of right female breast: C50.911

## 2023-07-31 ENCOUNTER — Ambulatory Visit
Admission: RE | Admit: 2023-07-31 | Discharge: 2023-07-31 | Disposition: A | Discharge status: Home / Self Care | Payer: Medicare HMO | Source: Ambulatory Visit | Attending: Hematology and Oncology | Admitting: Hematology and Oncology

## 2023-07-31 ENCOUNTER — Other Ambulatory Visit: Payer: Self-pay | Admitting: Hematology and Oncology

## 2023-07-31 ENCOUNTER — Ambulatory Visit
Admission: RE | Admit: 2023-07-31 | Discharge: 2023-07-31 | Disposition: A | Discharge status: Home / Self Care | Payer: Medicare HMO | Source: Ambulatory Visit | Attending: Internal Medicine | Admitting: Internal Medicine

## 2023-07-31 DIAGNOSIS — F17210 Nicotine dependence, cigarettes, uncomplicated: Secondary | ICD-10-CM

## 2023-07-31 DIAGNOSIS — R9389 Abnormal findings on diagnostic imaging of other specified body structures: Secondary | ICD-10-CM

## 2023-07-31 DIAGNOSIS — Z853 Personal history of malignant neoplasm of breast: Secondary | ICD-10-CM | POA: Diagnosis not present

## 2023-07-31 DIAGNOSIS — D0511 Intraductal carcinoma in situ of right breast: Secondary | ICD-10-CM

## 2023-07-31 DIAGNOSIS — N6341 Unspecified lump in right breast, subareolar: Secondary | ICD-10-CM | POA: Diagnosis not present

## 2023-07-31 DIAGNOSIS — Z122 Encounter for screening for malignant neoplasm of respiratory organs: Secondary | ICD-10-CM | POA: Diagnosis not present

## 2023-07-31 MED ORDER — GADOPICLENOL 0.5 MMOL/ML IV SOLN
7.0000 mL | Freq: Once | INTRAVENOUS | Status: AC | PRN
  Administered 2023-07-31: 7 mL via INTRAVENOUS

## 2023-08-06 ENCOUNTER — Other Ambulatory Visit (HOSPITAL_COMMUNITY): Payer: Self-pay | Admitting: Diagnostic Radiology

## 2023-08-06 ENCOUNTER — Ambulatory Visit
Admission: RE | Admit: 2023-08-06 | Discharge: 2023-08-06 | Disposition: A | Discharge status: Home / Self Care | Source: Ambulatory Visit | Attending: Hematology and Oncology

## 2023-08-06 DIAGNOSIS — R9389 Abnormal findings on diagnostic imaging of other specified body structures: Secondary | ICD-10-CM

## 2023-08-06 DIAGNOSIS — Z170 Estrogen receptor positive status [ER+]: Secondary | ICD-10-CM | POA: Diagnosis not present

## 2023-08-06 DIAGNOSIS — N6341 Unspecified lump in right breast, subareolar: Secondary | ICD-10-CM | POA: Diagnosis not present

## 2023-08-06 DIAGNOSIS — C50411 Malignant neoplasm of upper-outer quadrant of right female breast: Secondary | ICD-10-CM | POA: Diagnosis not present

## 2023-08-06 DIAGNOSIS — R92321 Mammographic fibroglandular density, right breast: Secondary | ICD-10-CM | POA: Diagnosis not present

## 2023-08-06 MED ORDER — GADOPICLENOL 0.5 MMOL/ML IV SOLN
7.0000 mL | Freq: Once | INTRAVENOUS | Status: AC | PRN
  Administered 2023-08-06: 7 mL via INTRAVENOUS

## 2023-08-07 ENCOUNTER — Telehealth: Payer: Self-pay | Admitting: Hematology and Oncology

## 2023-08-07 LAB — SURGICAL PATHOLOGY

## 2023-08-08 ENCOUNTER — Inpatient Hospital Stay: Payer: Medicare HMO | Admitting: Hematology and Oncology

## 2023-08-13 ENCOUNTER — Inpatient Hospital Stay: Attending: Hematology and Oncology | Admitting: Hematology and Oncology

## 2023-08-13 VITALS — BP 165/65 | HR 62 | Temp 97.5°F | Resp 18 | Ht 65.5 in | Wt 155.6 lb

## 2023-08-13 DIAGNOSIS — Z170 Estrogen receptor positive status [ER+]: Secondary | ICD-10-CM | POA: Diagnosis not present

## 2023-08-13 DIAGNOSIS — Z1721 Progesterone receptor positive status: Secondary | ICD-10-CM | POA: Diagnosis not present

## 2023-08-13 DIAGNOSIS — Z1732 Human epidermal growth factor receptor 2 negative status: Secondary | ICD-10-CM | POA: Insufficient documentation

## 2023-08-13 DIAGNOSIS — Z885 Allergy status to narcotic agent status: Secondary | ICD-10-CM | POA: Diagnosis not present

## 2023-08-13 DIAGNOSIS — C50411 Malignant neoplasm of upper-outer quadrant of right female breast: Secondary | ICD-10-CM | POA: Insufficient documentation

## 2023-08-13 DIAGNOSIS — Z79899 Other long term (current) drug therapy: Secondary | ICD-10-CM | POA: Diagnosis not present

## 2023-08-13 DIAGNOSIS — R9230 Dense breasts, unspecified: Secondary | ICD-10-CM | POA: Insufficient documentation

## 2023-08-13 NOTE — Progress Notes (Signed)
 Patient Care Team: Merri Brunette, MD as PCP - General (Internal Medicine) Serena Croissant, MD as Consulting Physician (Hematology and Oncology) Axel Filler Larna Daughters, NP as Nurse Practitioner (Hematology and Oncology) Emelia Loron, MD as Consulting Physician (General Surgery)  DIAGNOSIS:  Encounter Diagnosis  Name Primary?   Malignant neoplasm of upper-outer quadrant of right breast in female, estrogen receptor positive (HCC) Yes    SUMMARY OF ONCOLOGIC HISTORY: Oncology History  Malignant neoplasm of upper-outer quadrant of right breast in female, estrogen receptor positive (HCC)  10/24/2016 Initial Diagnosis   Screening mammogram at [facility] revealed 3 x 5 x 7 mm calcifications upper outer quadrant right breast;  stereotactic biopsy revealed DCIS with calcifications, intermediate grade, ER 0%, PR 0%, Tis Nx stage 0   12/18/2016 Surgery   Right lumpectomy: LCIS with calcifications and focal pleomorphic features, margins negative, right additional medial margin ALH    Miscellaneous   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  Because of this she did not need radiation and chemotherapy.   08/06/2023 Relapse/Recurrence   Breast MRI detected 3 mm enhancing mass upper central right breast biopsy: Grade 2 invasive lobular cancer ER 50%, PR 60%, Ki67 3%, HER2 1+ negative   08/13/2023 Cancer Staging   Staging form: Breast, AJCC 8th Edition - Clinical: Stage IA (cT1a, cN0, cM0, G2, ER+, PR+, HER2-) - Signed by Serena Croissant, MD on 08/13/2023 Histologic grading system: 3 grade system     CHIEF COMPLIANT: Follow-up to discuss recent recurrence of breast cancer  HISTORY OF PRESENT ILLNESS: History of Present Illness The patient, with a history of ductal carcinoma in situ (DCIS), presents with a new diagnosis of invasive lobular cancer in the breast. This diagnosis was made following an MRI, which picked up a small area of concern. The patient reports that this is a different  area from the one previously watched on mammograms. The last mammogram was in September, and everything appeared normal at that time. The patient questions whether the mammogram failed to pick up the current cancer, and the doctor suggests that it's possible, given the slow-growing nature of this type of cancer and its small size (3mm). The patient has not had any radiation therapy in the past, which leaves her with more treatment options now.     ALLERGIES:  is allergic to codeine.  MEDICATIONS:  Current Outpatient Medications  Medication Sig Dispense Refill   amLODipine (NORVASC) 5 MG tablet Take by mouth.     AREXVY 120 MCG/0.5ML injection 0.5 mLs.     COMIRNATY syringe Inject 0.3 mLs into the muscle.     FLUZONE HIGH-DOSE QUADRIVALENT 0.7 ML SUSY      Influenza vac split quadrivalent PF (FLUZONE HIGH-DOSE) 0.5 ML injection 0.5 mLs.     losartan-hydrochlorothiazide (HYZAAR) 100-25 MG tablet Take 1 tablet by mouth daily.     rosuvastatin (CRESTOR) 20 MG tablet Take by mouth daily.     No current facility-administered medications for this visit.    PHYSICAL EXAMINATION: ECOG PERFORMANCE STATUS: 1 - Symptomatic but completely ambulatory  Vitals:   08/13/23 1222  BP: (!) 165/65  Pulse: 62  Resp: 18  Temp: (!) 97.5 F (36.4 C)  SpO2: 99%   Filed Weights   08/13/23 1222  Weight: 155 lb 9.6 oz (70.6 kg)    LABORATORY DATA:  I have reviewed the data as listed     No data to display          Lab Results  Component Value  Date   WBC 8.0 07/26/2009   HGB 14.3 07/26/2009   HCT 42.0 07/26/2009   MCV 94.9 07/26/2009   PLT 262 07/26/2009    ASSESSMENT & PLAN:  Malignant neoplasm of upper-outer quadrant of right breast in female, estrogen receptor positive (HCC) 10/23/2016: Screening mammogram at [facility] revealed 3 x 5 x 7 mm calcifications upper outer quadrant right breast;  stereotactic biopsy revealed DCIS with calcifications, intermediate grade, ER 0%, PR 0%, Tis Nx  stage 0   12/18/2016: Right lumpectomy: LCIS with calcifications and focal pleomorphic features, margins negative, right additional medial margin ALH.   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   Breast cancer surveillance: Breast exam 04/28/2022: Benign Mammogram 01/25/2023: Benign breast density category B Breast MRI 07/31/2023: Indeterminate 3 mm enhancing mass upper central right breast.   MRI guided biopsy: Right breast 08/06/2023: Grade 2 invasive lobular cancer, ER 50%, PR 60%, Ki67 3%, HER2 1+ negative   Recommendation: Breast conserving surgery Adjuvant radiation therapy  Adjuvant antiestrogen therapy  Return to clinic after surgery is complete ------------------------------------- Assessment and Plan Assessment & Plan Invasive lobular carcinoma of the right breast 3 mm ER/PR positive, HER2 negative, grade 2, stage 1A lesion with no lymph node involvement. Favorable prognosis due to low proliferation index. Surgical options include lumpectomy and sentinel lymph node removal. Radiation therapy considered, with preference for shorter course. Anastrozole preferred post-surgery for antiestrogen therapy. - Consult surgeon for lumpectomy and sentinel lymph node biopsy, evaluate risks and benefits. - Consider post-surgical radiation therapy, preferably shorter course. - Discuss initiation of anastrozole post-surgery, considering side effect profile. - Schedule post-surgery follow-up to review findings and finalize treatment plan.  No orders of the defined types were placed in this encounter.  The patient has a good understanding of the overall plan. she agrees with it. she will call with any problems that may develop before the next visit here. Total time spent: 30 mins including face to face time and time spent for planning, charting and co-ordination of care   Tamsen Meek, MD 08/13/23

## 2023-08-13 NOTE — Assessment & Plan Note (Addendum)
 10/23/2016: Screening mammogram at [facility] revealed 3 x 5 x 7 mm calcifications upper outer quadrant right breast;  stereotactic biopsy revealed DCIS with calcifications, intermediate grade, ER 0%, PR 0%, Tis Nx stage 0   12/18/2016: Right lumpectomy: LCIS with calcifications and focal pleomorphic features, margins negative, right additional medial margin ALH.   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   Breast cancer surveillance: Breast exam 04/28/2022: Benign Mammogram 01/25/2023: Benign breast density category B Breast MRI 07/31/2023: Indeterminate 3 mm enhancing mass upper central right breast.   MRI guided biopsy: Right breast 08/06/2023: Grade 2 invasive lobular cancer, ER 50%, PR 60%, Ki67 3%, HER2 1+ negative   Recommendation: Breast conserving surgery Adjuvant radiation therapy  Adjuvant antiestrogen therapy  Return to clinic after surgery is complete

## 2023-08-14 ENCOUNTER — Encounter: Payer: Self-pay | Admitting: *Deleted

## 2023-08-15 ENCOUNTER — Other Ambulatory Visit: Payer: Self-pay | Admitting: General Surgery

## 2023-08-15 ENCOUNTER — Institutional Professional Consult (permissible substitution) (INDEPENDENT_AMBULATORY_CARE_PROVIDER_SITE_OTHER): Payer: Medicare HMO

## 2023-08-15 DIAGNOSIS — C50411 Malignant neoplasm of upper-outer quadrant of right female breast: Secondary | ICD-10-CM

## 2023-08-15 DIAGNOSIS — D225 Melanocytic nevi of trunk: Secondary | ICD-10-CM | POA: Diagnosis not present

## 2023-08-15 DIAGNOSIS — L820 Inflamed seborrheic keratosis: Secondary | ICD-10-CM | POA: Diagnosis not present

## 2023-08-15 DIAGNOSIS — C50412 Malignant neoplasm of upper-outer quadrant of left female breast: Secondary | ICD-10-CM | POA: Diagnosis not present

## 2023-08-15 DIAGNOSIS — Z85828 Personal history of other malignant neoplasm of skin: Secondary | ICD-10-CM | POA: Diagnosis not present

## 2023-08-15 DIAGNOSIS — D2272 Melanocytic nevi of left lower limb, including hip: Secondary | ICD-10-CM | POA: Diagnosis not present

## 2023-08-15 DIAGNOSIS — L812 Freckles: Secondary | ICD-10-CM | POA: Diagnosis not present

## 2023-08-15 DIAGNOSIS — Z170 Estrogen receptor positive status [ER+]: Secondary | ICD-10-CM | POA: Diagnosis not present

## 2023-08-15 DIAGNOSIS — L7211 Pilar cyst: Secondary | ICD-10-CM | POA: Diagnosis not present

## 2023-08-15 DIAGNOSIS — L821 Other seborrheic keratosis: Secondary | ICD-10-CM | POA: Diagnosis not present

## 2023-08-16 ENCOUNTER — Encounter: Payer: Self-pay | Admitting: Diagnostic Radiology

## 2023-08-20 DIAGNOSIS — C50412 Malignant neoplasm of upper-outer quadrant of left female breast: Secondary | ICD-10-CM | POA: Diagnosis not present

## 2023-08-21 ENCOUNTER — Encounter: Payer: Self-pay | Admitting: *Deleted

## 2023-08-22 ENCOUNTER — Encounter (HOSPITAL_BASED_OUTPATIENT_CLINIC_OR_DEPARTMENT_OTHER): Payer: Self-pay | Admitting: General Surgery

## 2023-08-22 DIAGNOSIS — R051 Acute cough: Secondary | ICD-10-CM | POA: Diagnosis not present

## 2023-08-22 DIAGNOSIS — R0989 Other specified symptoms and signs involving the circulatory and respiratory systems: Secondary | ICD-10-CM | POA: Diagnosis not present

## 2023-08-22 NOTE — Progress Notes (Signed)
 Patient states she developed URI symptoms on 08-19-23, productive cough with yellow/green sputum, sorethroat, no fever. Went to PCP today and received a Z-pak. I told her that her surgery would need to be postponed for 4 weeks orat least 2 weeks from resolution of symptoms. Toniann Fail at Dr Doreen Salvage office notified.

## 2023-08-28 ENCOUNTER — Encounter

## 2023-08-29 ENCOUNTER — Encounter

## 2023-09-04 ENCOUNTER — Encounter: Payer: Self-pay | Admitting: *Deleted

## 2023-09-06 ENCOUNTER — Telehealth: Payer: Self-pay | Admitting: Hematology and Oncology

## 2023-09-06 NOTE — Progress Notes (Signed)
 Surgical Instructions   Your procedure is scheduled on Thursday, April, 24th. Report to [facility] Main Entrance A at 5:30 A.M., then check in with the Admitting office. Any questions or running late day of surgery: call [phone]  Questions prior to your surgery date: call [phone], Monday-Friday, 8am-4pm. If you experience any cold or flu symptoms such as cough, fever, chills, shortness of breath, etc. between now and your scheduled surgery, please notify us  at the above number.     Remember:  Do not eat after midnight the night before your surgery   You may drink clear liquids until 4:30 the morning of your surgery.   Clear liquids allowed are: Water, Non-Citrus Juices (without pulp), Carbonated Beverages, Clear Tea (no milk, honey, etc.), Black Coffee Only (NO MILK, CREAM OR POWDERED CREAMER of any kind), and Gatorade.    Take these medicines the morning of surgery with A SIP OF WATER  amLODipine (NORVASC)  rosuvastatin (CRESTOR)    One week prior to surgery, STOP taking any Aspirin (unless otherwise instructed by your surgeon) Aleve, Naproxen, Ibuprofen, Motrin, Advil, Goody's, BC's, all herbal medications, fish oil, and non-prescription vitamins.                     Do NOT Smoke (Tobacco/Vaping) for 24 hours prior to your procedure.  If you use a CPAP at night, you may bring your mask/headgear for your overnight stay.   You will be asked to remove any contacts, glasses, piercing's, hearing aid's, dentures/partials prior to surgery. Please bring cases for these items if needed.    Patients discharged the day of surgery will not be allowed to drive home, and someone needs to stay with them for 24 hours.  SURGICAL WAITING ROOM VISITATION Patients may have no more than 2 support people in the waiting area - these visitors may rotate.   Pre-op nurse will coordinate an appropriate time for 1 ADULT support person, who may not rotate, to accompany patient in pre-op.   Children under the age of 44 must have an adult with them who is not the patient and must remain in the main waiting area with an adult.  If the patient needs to stay at the hospital during part of their recovery, the visitor guidelines for inpatient rooms apply.  Please refer to the [facility] website for the visitor guidelines for any additional information.   If you received a COVID test during your pre-op visit  it is requested that you wear a mask when out in public, stay away from anyone that may not be feeling well and notify your surgeon if you develop symptoms. If you have been in contact with anyone that has tested positive in the last 10 days please notify you surgeon.      Pre-operative CHG Bathing Instructions   You can play a key role in reducing the risk of infection after surgery. Your skin needs to be as free of germs as possible. You can reduce the number of germs on your skin by washing with CHG (chlorhexidine  gluconate) soap before surgery. CHG is an antiseptic soap that kills germs and continues to kill germs even after washing.   DO NOT use if you have an allergy to chlorhexidine /CHG or antibacterial soaps. If your skin becomes reddened or irritated, stop using the CHG and notify one of our RNs at [phone].              TAKE A SHOWER THE NIGHT BEFORE SURGERY  AND THE DAY OF SURGERY    Please keep in mind the following:  DO NOT shave, including legs and underarms, 48 hours prior to surgery.   You may shave your face before/day of surgery.  Place clean sheets on your bed the night before surgery Use a clean washcloth (not used since being washed) for each shower. DO NOT sleep with pet's night before surgery.  CHG Shower Instructions:  Wash your face and private area with normal soap. If you choose to wash your hair, wash first with your normal shampoo.  After you use shampoo/soap, rinse your hair and body thoroughly to remove shampoo/soap residue.  Turn the  water OFF and apply half the bottle of CHG soap to a CLEAN washcloth.  Apply CHG soap ONLY FROM YOUR NECK DOWN TO YOUR TOES (washing for 3-5 minutes)  DO NOT use CHG soap on face, private areas, open wounds, or sores.  Pay special attention to the area where your surgery is being performed.  If you are having back surgery, having someone wash your back for you may be helpful. Wait 2 minutes after CHG soap is applied, then you may rinse off the CHG soap.  Pat dry with a clean towel  Put on clean pajamas    Additional instructions for the day of surgery: DO NOT APPLY any lotions, deodorants, cologne, or perfumes.   Do not wear jewelry or makeup Do not wear nail polish, gel polish, artificial nails, or any other type of covering on natural nails (fingers and toes) Do not bring valuables to the hospital. [facility] is not responsible for valuables/personal belongings. Put on clean/comfortable clothes.  Please brush your teeth.  Ask your nurse before applying any prescription medications to the skin.

## 2023-09-06 NOTE — Telephone Encounter (Signed)
 Left vm about scheduled date and time

## 2023-09-07 ENCOUNTER — Other Ambulatory Visit: Payer: Self-pay

## 2023-09-07 ENCOUNTER — Encounter (HOSPITAL_COMMUNITY): Payer: Self-pay

## 2023-09-07 ENCOUNTER — Encounter (HOSPITAL_COMMUNITY)
Admission: RE | Admit: 2023-09-07 | Discharge: 2023-09-07 | Disposition: A | Discharge status: Home / Self Care | Source: Ambulatory Visit | Attending: General Surgery | Admitting: General Surgery

## 2023-09-07 VITALS — BP 136/65 | HR 59 | Temp 98.3°F | Resp 18 | Ht 64.5 in | Wt 153.3 lb

## 2023-09-07 DIAGNOSIS — Z01818 Encounter for other preprocedural examination: Secondary | ICD-10-CM | POA: Diagnosis not present

## 2023-09-07 LAB — BASIC METABOLIC PANEL WITH GFR
Anion gap: 10 (ref 5–15)
BUN: 20 mg/dL (ref 8–23)
CO2: 31 mmol/L (ref 22–32)
Calcium: 9.5 mg/dL (ref 8.9–10.3)
Chloride: 98 mmol/L (ref 98–111)
Creatinine, Ser: 0.62 mg/dL (ref 0.44–1.00)
GFR, Estimated: >60 mL/min (ref >60)
Glucose, Bld: 91 mg/dL (ref 70–99)
Potassium: 4.5 mmol/L (ref 3.5–5.1)
Sodium: 139 mmol/L (ref 135–145)

## 2023-09-07 LAB — CBC
HCT: 41.3 % (ref 36.0–46.0)
Hemoglobin: 13.8 g/dL (ref 12.0–15.0)
MCH: 31.2 pg (ref 26.0–34.0)
MCHC: 33.4 g/dL (ref 30.0–36.0)
MCV: 93.2 fL (ref 80.0–100.0)
Platelets: 414 10*3/uL — ABNORMAL HIGH (ref 150–400)
RBC: 4.43 MIL/uL (ref 3.87–5.11)
RDW: 13.1 % (ref 11.5–15.5)
WBC: 8.7 10*3/uL (ref 4.0–10.5)
nRBC: 0 % (ref 0.0–0.2)

## 2023-09-07 NOTE — Progress Notes (Signed)
 PCP - Clarice Ryan BIRCH, MD   Cardiologist -   PPM/ICD - denies Device Orders -  Rep Notified -   Chest x-ray - denies EKG - 09-07-23 Stress Test - denies ECHO - denies Cardiac Cath - denies  Sleep Study - denies  DM -denies  Blood Thinner Instructions: denies Aspirin Instructions: denies  ERAS Protcol - clear liquids until 4:30 PRE-SURGERY Ensure   COVID TEST- n/a   Anesthesia review: yes recently had URI treated with z pack. Denies any cough or fever at this time  Patient denies shortness of breath, fever, cough and chest pain at PAT appointment   All instructions explained to the patient, with a verbal understanding of the material. Patient agrees to go over the instructions while at home for a better understanding. Patient also instructed to self quarantine after being tested for COVID-19. The opportunity to ask questions was provided.

## 2023-09-12 ENCOUNTER — Ambulatory Visit
Admission: RE | Admit: 2023-09-12 | Discharge: 2023-09-12 | Disposition: A | Discharge status: Home / Self Care | Source: Ambulatory Visit | Attending: General Surgery | Admitting: General Surgery

## 2023-09-12 DIAGNOSIS — Z86000 Personal history of in-situ neoplasm of breast: Secondary | ICD-10-CM | POA: Diagnosis not present

## 2023-09-12 DIAGNOSIS — Z170 Estrogen receptor positive status [ER+]: Secondary | ICD-10-CM

## 2023-09-12 DIAGNOSIS — C50111 Malignant neoplasm of central portion of right female breast: Secondary | ICD-10-CM | POA: Diagnosis not present

## 2023-09-12 HISTORY — PX: BREAST BIOPSY: SHX20

## 2023-09-12 NOTE — Anesthesia Preprocedure Evaluation (Signed)
 Anesthesia Evaluation  Patient identified by MRN, date of birth, ID band Patient awake    Reviewed: Allergy & Precautions, NPO status , Patient's Chart, lab work & pertinent test results  Airway Mallampati: II       Dental no notable dental hx. (+) Dental Advisory Given   Pulmonary Current Smoker and Patient abstained from smoking.   Pulmonary exam normal breath sounds clear to auscultation       Cardiovascular hypertension, Pt. on medications  Rhythm:Regular Rate:Bradycardia     Neuro/Psych negative neurological ROS  negative psych ROS   GI/Hepatic negative GI ROS, Neg liver ROS,,,  Endo/Other  Hyperlipidemia Recurrent right Breast Ca  Renal/GU negative Renal ROS  negative genitourinary   Musculoskeletal  (+) Arthritis , Osteoarthritis,    Abdominal   Peds  Hematology negative hematology ROS (+)   Anesthesia Other Findings   Reproductive/Obstetrics                              Anesthesia Physical Anesthesia Plan  ASA: 2  Anesthesia Plan: General   Post-op Pain Management: Minimal or no pain anticipated, Dilaudid IV and Tylenol  PO (pre-op)*   Induction: Intravenous  PONV Risk Score and Plan: 3 and Treatment may vary due to age or medical condition, Ondansetron  and Dexamethasone   Airway Management Planned: LMA  Additional Equipment: None  Intra-op Plan:   Post-operative Plan: Extubation in OR  Informed Consent: I have reviewed the patients History and Physical, chart, labs and discussed the procedure including the risks, benefits and alternatives for the proposed anesthesia with the patient or authorized representative who has indicated his/her understanding and acceptance.     Dental advisory given  Plan Discussed with: Anesthesiologist and CRNA  Anesthesia Plan Comments:          Anesthesia Quick Evaluation

## 2023-09-13 ENCOUNTER — Ambulatory Visit (HOSPITAL_COMMUNITY)
Admission: RE | Admit: 2023-09-13 | Discharge: 2023-09-13 | Disposition: A | Discharge status: Home / Self Care | Attending: General Surgery | Admitting: General Surgery

## 2023-09-13 ENCOUNTER — Ambulatory Visit
Admission: RE | Admit: 2023-09-13 | Discharge: 2023-09-13 | Disposition: A | Discharge status: Home / Self Care | Source: Ambulatory Visit | Attending: General Surgery | Admitting: General Surgery

## 2023-09-13 ENCOUNTER — Encounter (HOSPITAL_COMMUNITY)
Admission: RE | Disposition: A | Discharge status: Home / Self Care | Payer: Self-pay | Source: Home / Self Care | Attending: General Surgery

## 2023-09-13 ENCOUNTER — Encounter (HOSPITAL_COMMUNITY): Payer: Self-pay | Admitting: General Surgery

## 2023-09-13 ENCOUNTER — Other Ambulatory Visit: Payer: Self-pay

## 2023-09-13 ENCOUNTER — Ambulatory Visit (HOSPITAL_COMMUNITY): Payer: Self-pay | Admitting: Physician Assistant

## 2023-09-13 ENCOUNTER — Ambulatory Visit (HOSPITAL_BASED_OUTPATIENT_CLINIC_OR_DEPARTMENT_OTHER): Payer: Self-pay | Admitting: Registered Nurse

## 2023-09-13 DIAGNOSIS — F17200 Nicotine dependence, unspecified, uncomplicated: Secondary | ICD-10-CM | POA: Diagnosis not present

## 2023-09-13 DIAGNOSIS — Z170 Estrogen receptor positive status [ER+]: Secondary | ICD-10-CM | POA: Insufficient documentation

## 2023-09-13 DIAGNOSIS — I10 Essential (primary) hypertension: Secondary | ICD-10-CM | POA: Diagnosis not present

## 2023-09-13 DIAGNOSIS — C50911 Malignant neoplasm of unspecified site of right female breast: Secondary | ICD-10-CM | POA: Diagnosis not present

## 2023-09-13 DIAGNOSIS — C50411 Malignant neoplasm of upper-outer quadrant of right female breast: Secondary | ICD-10-CM | POA: Insufficient documentation

## 2023-09-13 DIAGNOSIS — M1990 Unspecified osteoarthritis, unspecified site: Secondary | ICD-10-CM | POA: Insufficient documentation

## 2023-09-13 DIAGNOSIS — C50111 Malignant neoplasm of central portion of right female breast: Secondary | ICD-10-CM | POA: Diagnosis not present

## 2023-09-13 DIAGNOSIS — Z86000 Personal history of in-situ neoplasm of breast: Secondary | ICD-10-CM | POA: Diagnosis not present

## 2023-09-13 HISTORY — PX: BREAST LUMPECTOMY WITH RADIOACTIVE SEED LOCALIZATION: SHX6424

## 2023-09-13 HISTORY — DX: Family history of other specified conditions: Z84.89

## 2023-09-13 HISTORY — DX: Hyperlipidemia, unspecified: E78.5

## 2023-09-13 HISTORY — DX: Essential (primary) hypertension: I10

## 2023-09-13 SURGERY — BREAST LUMPECTOMY WITH RADIOACTIVE SEED LOCALIZATION
Anesthesia: General | Site: Breast | Laterality: Right

## 2023-09-13 MED ORDER — ORAL CARE MOUTH RINSE: 15.0000 mL | Freq: Once | OROMUCOSAL | Status: AC

## 2023-09-13 MED ORDER — SODIUM CHLORIDE 0.9% FLUSH: 3.0000 mL | INTRAVENOUS | Status: DC | PRN

## 2023-09-13 MED ORDER — CHLORHEXIDINE GLUCONATE 0.12 % MT SOLN
15.0000 mL | Freq: Once | OROMUCOSAL | Status: AC
  Administered 2023-09-13: 15 mL via OROMUCOSAL

## 2023-09-13 MED ORDER — ACETAMINOPHEN 325 MG PO TABS: 650.0000 mg | ORAL_TABLET | ORAL | Status: DC | PRN

## 2023-09-13 MED ORDER — EPHEDRINE 5 MG/ML INJ
INTRAVENOUS | Status: AC
  Filled 2023-09-13: qty 5

## 2023-09-13 MED ORDER — GLYCOPYRROLATE PF 0.2 MG/ML IJ SOSY
PREFILLED_SYRINGE | INTRAMUSCULAR | Status: DC | PRN
  Administered 2023-09-13: .2 mg via INTRAVENOUS

## 2023-09-13 MED ORDER — DROPERIDOL 2.5 MG/ML IJ SOLN: 0.6250 mg | Freq: Once | INTRAMUSCULAR | Status: DC | PRN

## 2023-09-13 MED ORDER — ENSURE PRE-SURGERY PO LIQD: 296.0000 mL | Freq: Once | ORAL | Status: DC

## 2023-09-13 MED ORDER — CHLORHEXIDINE GLUCONATE CLOTH 2 % EX PADS: 6.0000 | MEDICATED_PAD | Freq: Once | CUTANEOUS | Status: DC

## 2023-09-13 MED ORDER — ACETAMINOPHEN 500 MG PO TABS
1000.0000 mg | ORAL_TABLET | ORAL | Status: AC
  Administered 2023-09-13: 1000 mg via ORAL

## 2023-09-13 MED ORDER — ONDANSETRON HCL 4 MG/2ML IJ SOLN
INTRAMUSCULAR | Status: AC
  Filled 2023-09-13: qty 2

## 2023-09-13 MED ORDER — FENTANYL CITRATE (PF) 250 MCG/5ML IJ SOLN
INTRAMUSCULAR | Status: DC | PRN
  Administered 2023-09-13 (×2): 50 ug via INTRAVENOUS

## 2023-09-13 MED ORDER — CEFAZOLIN SODIUM-DEXTROSE 2-4 GM/100ML-% IV SOLN
2.0000 g | INTRAVENOUS | Status: AC
  Administered 2023-09-13: 2 g via INTRAVENOUS

## 2023-09-13 MED ORDER — ONDANSETRON HCL 4 MG/2ML IJ SOLN: 4.0000 mg | Freq: Once | INTRAMUSCULAR | Status: DC | PRN

## 2023-09-13 MED ORDER — SODIUM CHLORIDE 0.9 % IV SOLN: 250.0000 mL | INTRAVENOUS | Status: DC | PRN

## 2023-09-13 MED ORDER — DEXAMETHASONE SODIUM PHOSPHATE 10 MG/ML IJ SOLN
INTRAMUSCULAR | Status: AC
  Filled 2023-09-13: qty 1

## 2023-09-13 MED ORDER — LIDOCAINE 2% (20 MG/ML) 5 ML SYRINGE
INTRAMUSCULAR | Status: AC
  Filled 2023-09-13: qty 5

## 2023-09-13 MED ORDER — EPHEDRINE SULFATE-NACL 50-0.9 MG/10ML-% IV SOSY
PREFILLED_SYRINGE | INTRAVENOUS | Status: DC | PRN
  Administered 2023-09-13 (×2): 5 mg via INTRAVENOUS

## 2023-09-13 MED ORDER — PROPOFOL 10 MG/ML IV BOLUS
INTRAVENOUS | Status: AC
  Filled 2023-09-13: qty 20

## 2023-09-13 MED ORDER — OXYCODONE HCL 5 MG PO TABS: 5.0000 mg | ORAL_TABLET | Freq: Once | ORAL | Status: DC | PRN

## 2023-09-13 MED ORDER — OXYCODONE HCL 5 MG/5ML PO SOLN: 5.0000 mg | Freq: Once | ORAL | Status: DC | PRN

## 2023-09-13 MED ORDER — DEXAMETHASONE SODIUM PHOSPHATE 10 MG/ML IJ SOLN
INTRAMUSCULAR | Status: DC | PRN
  Administered 2023-09-13: 10 mg via INTRAVENOUS

## 2023-09-13 MED ORDER — OXYCODONE HCL 5 MG PO TABS: 5.0000 mg | ORAL_TABLET | ORAL | Status: DC | PRN

## 2023-09-13 MED ORDER — FENTANYL CITRATE (PF) 250 MCG/5ML IJ SOLN
INTRAMUSCULAR | Status: AC
  Filled 2023-09-13: qty 5

## 2023-09-13 MED ORDER — LIDOCAINE 2% (20 MG/ML) 5 ML SYRINGE
INTRAMUSCULAR | Status: DC | PRN
  Administered 2023-09-13: 80 mg via INTRAVENOUS

## 2023-09-13 MED ORDER — ACETAMINOPHEN 650 MG RE SUPP: 650.0000 mg | RECTAL | Status: DC | PRN

## 2023-09-13 MED ORDER — BUPIVACAINE-EPINEPHRINE 0.25% -1:200000 IJ SOLN
INTRAMUSCULAR | Status: DC | PRN
  Administered 2023-09-13: 10 mL

## 2023-09-13 MED ORDER — BUPIVACAINE-EPINEPHRINE (PF) 0.25% -1:200000 IJ SOLN
INTRAMUSCULAR | Status: AC
  Filled 2023-09-13: qty 30

## 2023-09-13 MED ORDER — PROPOFOL 10 MG/ML IV BOLUS
INTRAVENOUS | Status: DC | PRN
  Administered 2023-09-13: 100 mg via INTRAVENOUS
  Administered 2023-09-13 (×2): 50 mg via INTRAVENOUS

## 2023-09-13 MED ORDER — FENTANYL CITRATE (PF) 100 MCG/2ML IJ SOLN: 25.0000 ug | INTRAMUSCULAR | Status: DC | PRN

## 2023-09-13 MED ORDER — LACTATED RINGERS IV SOLN
INTRAVENOUS | Status: DC | PRN
Start: 2023-09-13 — End: 2023-09-13

## 2023-09-13 MED ORDER — ONDANSETRON HCL 4 MG/2ML IJ SOLN
INTRAMUSCULAR | Status: DC | PRN
  Administered 2023-09-13: 4 mg via INTRAVENOUS

## 2023-09-13 MED ORDER — 0.9 % SODIUM CHLORIDE (POUR BTL) OPTIME
TOPICAL | Status: DC | PRN
  Administered 2023-09-13: 1000 mL

## 2023-09-13 SURGICAL SUPPLY — 35 items
BAG COUNTER SPONGE SURGICOUNT (BAG) ×1 IMPLANT
BINDER BREAST LRG (GAUZE/BANDAGES/DRESSINGS) IMPLANT
BINDER BREAST XLRG (GAUZE/BANDAGES/DRESSINGS) IMPLANT
CANISTER SUCT 3000ML PPV (MISCELLANEOUS) ×1 IMPLANT
CHLORAPREP W/TINT 26 (MISCELLANEOUS) ×1 IMPLANT
CLIP APPLIE 9.375 MED OPEN (MISCELLANEOUS) IMPLANT
CLIP TI MEDIUM 6 (CLIP) IMPLANT
CNTNR URN SCR LID CUP LEK RST (MISCELLANEOUS) IMPLANT
COVER PROBE W GEL 5X96 (DRAPES) ×1 IMPLANT
COVER SURGICAL LIGHT HANDLE (MISCELLANEOUS) ×1 IMPLANT
DERMABOND ADVANCED .7 DNX12 (GAUZE/BANDAGES/DRESSINGS) ×1 IMPLANT
DEVICE DUBIN SPECIMEN MAMMOGRA (MISCELLANEOUS) ×1 IMPLANT
DRAPE CHEST BREAST 15X10 FENES (DRAPES) ×1 IMPLANT
ELECT CAUTERY BLADE 6.4 (BLADE) IMPLANT
ELECT COATED BLADE 2.86 ST (ELECTRODE) ×1 IMPLANT
ELECTRODE REM PT RTRN 9FT ADLT (ELECTROSURGICAL) ×1 IMPLANT
GLOVE BIO SURGEON STRL SZ7 (GLOVE) ×2 IMPLANT
GLOVE BIOGEL PI IND STRL 7.5 (GLOVE) ×1 IMPLANT
GOWN STRL REUS W/ TWL LRG LVL3 (GOWN DISPOSABLE) ×2 IMPLANT
KIT BASIN OR (CUSTOM PROCEDURE TRAY) ×1 IMPLANT
KIT MARKER MARGIN INK (KITS) ×1 IMPLANT
LIGHT WAVEGUIDE WIDE FLAT (MISCELLANEOUS) IMPLANT
NDL HYPO 25GX1X1/2 BEV (NEEDLE) ×1 IMPLANT
NEEDLE HYPO 25GX1X1/2 BEV (NEEDLE) ×1 IMPLANT
NS IRRIG 1000ML POUR BTL (IV SOLUTION) ×1 IMPLANT
PACK GENERAL/GYN (CUSTOM PROCEDURE TRAY) ×1 IMPLANT
STRIP CLOSURE SKIN 1/2X4 (GAUZE/BANDAGES/DRESSINGS) ×1 IMPLANT
SUT MNCRL AB 4-0 PS2 18 (SUTURE) ×1 IMPLANT
SUT MON AB 5-0 PS2 18 (SUTURE) IMPLANT
SUT SILK 2 0 SH (SUTURE) IMPLANT
SUT VIC AB 2-0 SH 27XBRD (SUTURE) ×1 IMPLANT
SUT VIC AB 3-0 SH 27X BRD (SUTURE) ×1 IMPLANT
SYR CONTROL 10ML LL (SYRINGE) ×1 IMPLANT
TOWEL GREEN STERILE (TOWEL DISPOSABLE) ×1 IMPLANT
TOWEL GREEN STERILE FF (TOWEL DISPOSABLE) ×1 IMPLANT

## 2023-09-13 NOTE — Discharge Instructions (Signed)
 [facility] Office Phone Number [phone]  POST OP INSTRUCTIONS Take 400 mg of ibuprofen every 8 hours or 650 mg tylenol every 6 hours for next 72 hours then as needed. Use ice several times daily also.  A prescription for pain medication may be given to you upon discharge.  Take your pain medication as prescribed, if needed.  If narcotic pain medicine is not needed, then you may take acetaminophen (Tylenol), naprosyn (Alleve) or ibuprofen (Advil) as needed. Take your usually prescribed medications unless otherwise directed If you need a refill on your pain medication, please contact your pharmacy.  They will contact our office to request authorization.  Prescriptions will not be filled after 5pm or on week-ends. You should eat very light the first 24 hours after surgery, such as soup, crackers, pudding, etc.  Resume your normal diet the day after surgery. Most patients will experience some swelling and bruising in the breast.  Ice packs and a good support bra will help.  Wear the breast binder provided or a sports bra for 72 hours day and night.  After that wear a sports bra during the day until you return to the office. Swelling and bruising can take several days to resolve.  It is common to experience some constipation if taking pain medication after surgery.  Increasing fluid intake and taking a stool softener will usually help or prevent this problem from occurring.  A mild laxative (Milk of Magnesia or Miralax) should be taken according to package directions if there are no bowel movements after 48 hours. I used skin glue on the incision, you may shower in 24 hours.  The glue will flake off over the next 2-3 weeks.  Any sutures or staples will be removed at the office during your follow-up visit. ACTIVITIES:  You may resume regular daily activities (gradually increasing) beginning the next day.  Wearing a good support bra or sports bra minimizes pain and swelling.  You may have  sexual intercourse when it is comfortable. You may drive when you no longer are taking prescription pain medication, you can comfortably wear a seatbelt, and you can safely maneuver your car and apply brakes. RETURN TO WORK:  ______________________________________________________________________________________ Kristin Brennan should see your doctor in the office for a follow-up appointment approximately two weeks after your surgery.  Your doctor's nurse will typically make your follow-up appointment when she calls you with your pathology report.  Expect your pathology report 3-4 business days after your surgery.  You may call to check if you do not hear from [place] after three days. OTHER INSTRUCTIONS: _______________________________________________________________________________________________ _____________________________________________________________________________________________________________________________________ _____________________________________________________________________________________________________________________________________ _____________________________________________________________________________________________________________________________________  WHEN TO CALL DR Kristin Brennan: Fever over 101.0 Nausea and/or vomiting. Extreme swelling or bruising. Continued bleeding from incision. Increased pain, redness, or drainage from the incision.  The clinic staff is available to answer your questions during regular business hours.  Please don't hesitate to call and ask to speak to one of the nurses for clinical concerns.  If you have a medical emergency, go to the nearest emergency room or call 911.  A surgeon from [facility] is always on call at the hospital.  For further questions, please visit [email or web address]

## 2023-09-13 NOTE — Anesthesia Procedure Notes (Addendum)
 Procedure Name: Intubation Date/Time: 09/13/2023 8:02 AM  Performed by: Jamas Maywood, CRNAPre-anesthesia Checklist: Patient identified, Emergency Drugs available, Suction available and Patient being monitored Patient Re-evaluated:Patient Re-evaluated prior to induction Oxygen Delivery Method: Circle system utilized Preoxygenation: Pre-oxygenation with 100% oxygen Induction Type: IV induction Ventilation: Mask ventilation without difficulty Laryngoscope Size: Mac and 4 Grade View: Grade II Tube type: Oral Tube size: 7.0 mm Number of attempts: 1 Airway Equipment and Method: Stylet and Oral airway Placement Confirmation: ETT inserted through vocal cords under direct vision, positive ETCO2 and breath sounds checked- equal and bilateral Secured at: 21 cm Tube secured with: Tape Dental Injury: Teeth and Oropharynx as per pre-operative assessment

## 2023-09-13 NOTE — H&P (Signed)
 73 year old female that I did a right lumpectomy on in 2018. The core biopsy initially thought this was ductal carcinoma in situ. Following lumpectomy the diagnosis from the core as well as the final specimen was diagnosed as pleomorphic LCIS. The margins were negative. She was then observed. Her last mammogram was in September 2024 which is negative. On an MRI that was for screening she was noted to have a 3 mm upper central left breast mass. There are no abnormal lymph nodes. Biopsy was done on this mass that is a grade 2 invasive lobular carcinoma that is 50% ER positive, 60% PR positive, HER2 negative and the proliferation index is 3%.  Review of Systems: A complete review of systems was obtained from the patient. I have reviewed this information and discussed as appropriate with the patient. See HPI as well for other ROS.  Review of Systems  All other systems reviewed and are negative.   Medical History: No past medical history on file.  Patient Active Problem List  Diagnosis  Malignant neoplasm of upper-outer quadrant of left breast in female, estrogen receptor positive (CMS/HHS-HCC)   No past surgical history on file.   Not on File  No current outpatient medications on file prior to visit.   No current facility-administered medications on file prior to visit.   No family history on file.   Social History   Tobacco Use  Smoking Status Not on file  Smokeless Tobacco Not on file    Social History   Socioeconomic History  Marital status: Single   Objective:  There were no vitals filed for this visit.  There is no height or weight on file to calculate BMI.  Physical Exam Vitals reviewed.  Constitutional:  Appearance: Normal appearance.  Chest:  Breasts: Right: No inverted nipple, mass or nipple discharge.  Left: No inverted nipple, mass or nipple discharge.  Lymphadenopathy:  Upper Body:  Right upper body: No supraclavicular or axillary adenopathy.  Left upper  body: No supraclavicular or axillary adenopathy.  Neurological:  Mental Status: She is alert.     Assessment and Plan:    Right breast seed guided lumpectomy  We discussed the staging and pathophysiology of breast cancer. We discussed all of the different options for treatment for breast cancer including surgery, chemotherapy, radiation therapy, and antiestrogen therapy.  We discussed a sentinel lymph node biopsy today. She does have a lobular cancer and this was a small part of the studies done and omitting lymph node biopsies. However this is a 3 mm cancer and we discussed the pros and cons of the lymph node biopsy as well as the fact that this was a small part of the studies done and I think we are okay omitting a lymph node biopsy as I do not think it is going to change her treatment or her's long-term survival or recurrence. Oncology is agreeable to this as well.  We discussed the options for treatment of the breast cancer which included lumpectomy versus a mastectomy. We discussed the performance of the lumpectomy with radioactive seed placement. We discussed a 5-10% chance of a positive margin requiring reexcision in the operating room. We also discussed that she will likely need radiation therapy if she undergoes lumpectomy. We discussed mastectomy and the postoperative care for that as well. Mastectomy can be followed by reconstruction. The decision for lumpectomy vs mastectomy has no impact on decision for chemotherapy. Most mastectomy patients will not need radiation therapy. We discussed that there is no difference  in her survival whether she undergoes lumpectomy with radiation therapy or antiestrogen therapy versus a mastectomy. There is also no real difference between her recurrence in the breast.  We discussed the risks of operation including bleeding, infection, possible reoperation. She understands her further therapy will be based on what her stages at the time of her  operation.

## 2023-09-13 NOTE — Interval H&P Note (Signed)
 History and Physical Interval Note:  09/13/2023 7:33 AM  Kristin Brennan  has presented today for surgery, with the diagnosis of RIGHT BREAST CANCER.  The various methods of treatment have been discussed with the patient and family. After consideration of risks, benefits and other options for treatment, the patient has consented to  Procedure(s) with comments: BREAST LUMPECTOMY WITH RADIOACTIVE SEED LOCALIZATION (Right) - Right as a surgical intervention.  The patient's history has been reviewed, patient examined, no change in status, stable for surgery.  I have reviewed the patient's chart and labs.  Questions were answered to the patient's satisfaction.     Kristin Brennan

## 2023-09-13 NOTE — Transfer of Care (Signed)
 Immediate Anesthesia Transfer of Care Note  Patient: Kristin Brennan  Procedure(s) Performed: RIGHT BREAST LUMPECTOMY WITH RADIOACTIVE SEED LOCALIZATION (Right: Breast)  Patient Location: PACU  Anesthesia Type:General  Level of Consciousness: alert , oriented, drowsy, and patient cooperative  Airway & Oxygen Therapy: Patient connected to nasal cannula oxygen  Post-op Assessment: Report given to RN, Post -op Vital signs reviewed and stable, and Patient moving all extremities X 4  Post vital signs: Reviewed and stable  Last Vitals:  Vitals Value Taken Time  BP 141/69 09/13/23 0850  Temp 36.5 C 09/13/23 0850  Pulse 74 09/13/23 0851  Resp 10 09/13/23 0851  SpO2 93% 09/13/23 0851  Vitals shown include unfiled device data.  Last Pain:  Vitals:   09/13/23 0645  TempSrc:   PainSc: 0-No pain         Complications: There were no known notable events for this encounter.

## 2023-09-13 NOTE — Op Note (Signed)
 Preoperative diagnosis: Right breast cancer, clinical stage I Postoperative diagnosis: Same as above Procedure:Right breast seed guided lumpectomy Surgeon: Dr. Donavan Fuchs Anesthesia: General Estimated blood loss: Minimal Complications: None Drains: None Specimens: Right breast mass containing seed and clip marked with paint, additional medial margin marked short superior, long lateral, double deep Disposition to recovery stable addition   Indications: 73 year old female that I did a right lumpectomy on in 2018. The core biopsy initially thought this was ductal carcinoma in situ. Following lumpectomy the diagnosis from the core as well as the final specimen was diagnosed as pleomorphic LCIS. The margins were negative. She was then observed. Her last mammogram was in September 2024 which is negative. On an MRI that was for screening she was noted to have a 3 mm upper central left breast mass. There are no abnormal lymph nodes. Biopsy was done on this mass that is a grade 2 invasive lobular carcinoma that is 50% ER positive, 60% PR positive, HER2 negative and the proliferation index is 3%. We discussed lumpectomy   Procedure: After informed consent was obtained she was taken to the operating room.  She had had the seed placed prior to this and I had these mammograms available for my review.  She had SCDs in place.  She was given antibiotics.  She was placed under general anesthesia without complication.  She was prepped and draped in standard sterile surgical fashion.  A surgical timeout was then performed.   I infiltrated Marcaine  and then made a periareolar incision.  I then removed the seed and surrounding tissue to get a clear margin.  I removed this and passed it off the table after marking it with paint.  A mammogram confirmed removal of the seed and the clip.  I removed  additional margin medially that looked close on the 3D imaging. I then obtained hemostasis.  I closed the breast tissue with  2-0 Vicryl.  The skin was closed with 3-0 Vicryl and 5-0 Monocryl.  Glue and Steri-Strips were applied.  She tolerated this well was transferred recovery stable.

## 2023-09-13 NOTE — Anesthesia Postprocedure Evaluation (Signed)
 Anesthesia Post Note  Patient: AHTZIRI JEFFRIES  Procedure(s) Performed: RIGHT BREAST LUMPECTOMY WITH RADIOACTIVE SEED LOCALIZATION (Right: Breast)     Patient location during evaluation: PACU Anesthesia Type: General Level of consciousness: awake and alert and oriented Pain management: pain level controlled Vital Signs Assessment: post-procedure vital signs reviewed and stable Respiratory status: spontaneous breathing, nonlabored ventilation and respiratory function stable Cardiovascular status: blood pressure returned to baseline and stable Postop Assessment: no apparent nausea or vomiting Anesthetic complications: no   There were no known notable events for this encounter.  Last Vitals:  Vitals:   09/13/23 0850 09/13/23 0900  BP: (!) 141/69 139/71  Pulse: 79 64  Resp: 16 13  Temp: 36.5 C   SpO2: 91% 93%    Last Pain:  Vitals:   09/13/23 0850  TempSrc:   PainSc: Asleep                 Jackqueline Aquilar A.

## 2023-09-14 ENCOUNTER — Encounter (HOSPITAL_COMMUNITY): Payer: Self-pay | Admitting: General Surgery

## 2023-09-19 ENCOUNTER — Encounter: Payer: Self-pay | Admitting: *Deleted

## 2023-09-19 LAB — SURGICAL PATHOLOGY

## 2023-09-25 ENCOUNTER — Inpatient Hospital Stay: Attending: Hematology and Oncology | Admitting: Hematology and Oncology

## 2023-09-25 VITALS — BP 131/57 | HR 60 | Temp 98.2°F | Resp 18 | Ht 64.5 in | Wt 153.6 lb

## 2023-09-25 DIAGNOSIS — Z79811 Long term (current) use of aromatase inhibitors: Secondary | ICD-10-CM | POA: Insufficient documentation

## 2023-09-25 DIAGNOSIS — Z1721 Progesterone receptor positive status: Secondary | ICD-10-CM | POA: Diagnosis not present

## 2023-09-25 DIAGNOSIS — Z885 Allergy status to narcotic agent status: Secondary | ICD-10-CM | POA: Insufficient documentation

## 2023-09-25 DIAGNOSIS — Z79899 Other long term (current) drug therapy: Secondary | ICD-10-CM | POA: Insufficient documentation

## 2023-09-25 DIAGNOSIS — Z803 Family history of malignant neoplasm of breast: Secondary | ICD-10-CM | POA: Diagnosis not present

## 2023-09-25 DIAGNOSIS — Z170 Estrogen receptor positive status [ER+]: Secondary | ICD-10-CM | POA: Diagnosis not present

## 2023-09-25 DIAGNOSIS — Z1732 Human epidermal growth factor receptor 2 negative status: Secondary | ICD-10-CM | POA: Diagnosis not present

## 2023-09-25 DIAGNOSIS — M8580 Other specified disorders of bone density and structure, unspecified site: Secondary | ICD-10-CM | POA: Diagnosis not present

## 2023-09-25 DIAGNOSIS — C50411 Malignant neoplasm of upper-outer quadrant of right female breast: Secondary | ICD-10-CM | POA: Insufficient documentation

## 2023-09-25 MED ORDER — ANASTROZOLE 1 MG PO TABS: 1.0000 mg | ORAL_TABLET | Freq: Every day | ORAL | 3 refills | Status: AC

## 2023-09-25 NOTE — Assessment & Plan Note (Signed)
 10/23/2016: Screening mammogram at [facility] revealed 3 x 5 x 7 mm calcifications upper outer quadrant right breast;  stereotactic biopsy revealed DCIS with calcifications, intermediate grade, ER 0%, PR 0%, Tis Nx stage 0   12/18/2016: Right lumpectomy: LCIS with calcifications and focal pleomorphic features, margins negative, right additional medial margin ALH.  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  07/31/2023: Indeterminate 3 mm enhancing mass right breast, MRI guided biopsy 08/06/2023: Grade 2 ILC ER 50%, PR 60%, Ki-67 3%, HER2 1+ negative  09/13/2023: Right lumpectomy: Benign no residual cancer identified  Treatment plan: Adjuvant radiation therapy Adjuvant antiestrogen therapy  Return to clinic after radiation is completed

## 2023-09-25 NOTE — Progress Notes (Signed)
 Patient Care Team: Imelda Man, MD as PCP - General (Internal Medicine) Cameron Cea, MD as Consulting Physician (Hematology and Oncology) Debbie Fails, Laura Polio, NP as Nurse Practitioner (Hematology and Oncology) Enid Harry, MD as Consulting Physician (General Surgery) Auther Bo, RN as Oncology Nurse Navigator Alane Hsu, RN as Oncology Nurse Navigator  DIAGNOSIS:  Encounter Diagnosis  Name Primary?   Malignant neoplasm of upper-outer quadrant of right breast in female, estrogen receptor positive (HCC) Yes    SUMMARY OF ONCOLOGIC HISTORY: Oncology History  Malignant neoplasm of upper-outer quadrant of right breast in female, estrogen receptor positive (HCC)  10/24/2016 Initial Diagnosis   Screening mammogram at [facility] revealed 3 x 5 x 7 mm calcifications upper outer quadrant right breast;  stereotactic biopsy revealed DCIS with calcifications, intermediate grade, ER 0%, PR 0%, Tis Nx stage 0   12/18/2016 Surgery   Right lumpectomy: LCIS with calcifications and focal pleomorphic features, margins negative, right additional medial margin ALH    Miscellaneous   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  Because of this she did not need radiation and chemotherapy.   08/06/2023 Relapse/Recurrence   Breast MRI detected 3 mm enhancing mass upper central right breast biopsy: Grade 2 invasive lobular cancer ER 50%, PR 60%, Ki67 3%, HER2 1+ negative   08/13/2023 Cancer Staging   Staging form: Breast, AJCC 8th Edition - Clinical: Stage IA (cT1a, cN0, cM0, G2, ER+, PR+, HER2-) - Signed by Cameron Cea, MD on 08/13/2023 Histologic grading system: 3 grade system     CHIEF COMPLIANT: Follow-up to discuss results of recent surgery  HISTORY OF PRESENT ILLNESS:  History of Present Illness Kristin Brennan is a 73 year old female with breast cancer who presents for follow-up after surgery.  She underwent surgery for breast cancer, with biopsy results  showing no residual cancer and only benign breast tissue. The tumor was 0.3 cm, estrogen receptor positive, progesterone receptor positive, and HER2 negative. KI-67 was 3%, indicating slow growth.  Breast cancer was first diagnosed in 2018, and radiation therapy was not pursued at that time. She has osteopenia and takes vitamin D supplements.  Her mother took tamoxifen for breast cancer. She is aware of the different side effect profile of anastrozole, including its lack of bone protection compared to tamoxifen.  She experiences no major pain or discomfort post-surgery.     ALLERGIES:  is allergic to codeine.  MEDICATIONS:  Current Outpatient Medications  Medication Sig Dispense Refill   amLODipine (NORVASC) 5 MG tablet Take 5 mg by mouth daily.     Calcium Carb-Cholecalciferol (CALCIUM 600/VITAMIN D3 PO) Take 1 tablet by mouth daily.     cholecalciferol (VITAMIN D3) 25 MCG (1000 UNIT) tablet Take 1,000 Units by mouth daily.     losartan-hydrochlorothiazide (HYZAAR) 100-25 MG tablet Take 1 tablet by mouth daily.     Multiple Vitamins-Minerals (HAIR SKIN & NAILS ADVANCED PO) Take 1 tablet by mouth daily.     Multiple Vitamins-Minerals (PRESERVISION AREDS 2 PO) Take 1 capsule by mouth in the morning and at bedtime.     rosuvastatin (CRESTOR) 20 MG tablet Take 20 mg by mouth daily.     No current facility-administered medications for this visit.    PHYSICAL EXAMINATION: ECOG PERFORMANCE STATUS: 1 - Symptomatic but completely ambulatory  Vitals:   09/25/23 1123  BP: (!) 131/57  Pulse: 60  Resp: 18  Temp: 98.2 F (36.8 C)  SpO2: 99%   Filed Weights   09/25/23 1123  Weight: 153 lb 9.6 oz (69.7 kg)    LABORATORY DATA:  I have reviewed the data as listed    Latest Ref Rng & Units 09/07/2023   11:10 AM  CMP  Glucose 70 - 99 mg/dL 91   BUN 8 - 23 mg/dL 20   Creatinine 2.95 - 1.00 mg/dL 1.88   Sodium 416 - 606 mmol/L 139   Potassium 3.5 - 5.1 mmol/L 4.5   Chloride 98 - 111  mmol/L 98   CO2 22 - 32 mmol/L 31   Calcium 8.9 - 10.3 mg/dL 9.5     Lab Results  Component Value Date   WBC 8.7 09/07/2023   HGB 13.8 09/07/2023   HCT 41.3 09/07/2023   MCV 93.2 09/07/2023   PLT 414 (H) 09/07/2023    ASSESSMENT & PLAN:  Malignant neoplasm of upper-outer quadrant of right breast in female, estrogen receptor positive (HCC) 10/23/2016: Screening mammogram at [facility] revealed 3 x 5 x 7 mm calcifications upper outer quadrant right breast;  stereotactic biopsy revealed DCIS with calcifications, intermediate grade, ER 0%, PR 0%, Tis Nx stage 0   12/18/2016: Right lumpectomy: LCIS with calcifications and focal pleomorphic features, margins negative, right additional medial margin ALH.  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  07/31/2023: Indeterminate 3 mm enhancing mass right breast, MRI guided biopsy 08/06/2023: Grade 2 ILC ER 50%, PR 60%, Ki-67 3%, HER2 1+ negative  09/13/2023: Right lumpectomy: Benign no residual cancer identified  Treatment plan: Adjuvant radiation therapy: Patient does not want to do radiation. Adjuvant antiestrogen therapy: With anastrozole 1 mg daily started 09/25/2023  Return to clinic in 3 months for survivorship care plan visit  Assessment & Plan Malignant neoplasm of right breast Estrogen receptor-positive invasive lobular carcinoma, early-stage, HER2-negative, slow growth. Recent biopsy showed no residual cancer. - Prescribe anastrozole 1 mg once daily for five years. - Monitor bone density every two years. - Ensure adequate intake of calcium and vitamin D. - Encourage weight-bearing exercises such as walking. - Send a message to the surgeon, Dr. Delane Fear, to follow up on her post-surgical care. - Schedule a follow-up appointment with the nurse practitioner in three months for a survivorship visit.  Osteopenia Osteopenia with increased osteoporosis risk due to antiestrogen  therapy. - Monitor bone density every two years. - Ensure adequate intake of calcium and vitamin D. - Encourage weight-bearing exercises such as walking.      No orders of the defined types were placed in this encounter.  The patient has a good understanding of the overall plan. she agrees with it. she will call with any problems that may develop before the next visit here. Total time spent: 30 mins including face to face time and time spent for planning, charting and co-ordination of care   Margert Sheerer, MD 09/25/23

## 2023-09-26 ENCOUNTER — Encounter: Payer: Self-pay | Admitting: *Deleted

## 2023-09-26 DIAGNOSIS — C50411 Malignant neoplasm of upper-outer quadrant of right female breast: Secondary | ICD-10-CM

## 2023-10-02 ENCOUNTER — Encounter: Payer: Self-pay | Admitting: Orthopedic Surgery

## 2023-10-18 ENCOUNTER — Encounter (INDEPENDENT_AMBULATORY_CARE_PROVIDER_SITE_OTHER): Payer: Self-pay | Admitting: Otolaryngology

## 2023-10-18 ENCOUNTER — Ambulatory Visit (INDEPENDENT_AMBULATORY_CARE_PROVIDER_SITE_OTHER): Admitting: Otolaryngology

## 2023-10-18 VITALS — BP 128/79 | HR 59 | Ht 64.5 in | Wt 155.0 lb

## 2023-10-18 DIAGNOSIS — K219 Gastro-esophageal reflux disease without esophagitis: Secondary | ICD-10-CM

## 2023-10-18 DIAGNOSIS — Z87891 Personal history of nicotine dependence: Secondary | ICD-10-CM

## 2023-10-18 DIAGNOSIS — R490 Dysphonia: Secondary | ICD-10-CM | POA: Diagnosis not present

## 2023-10-18 MED ORDER — FAMOTIDINE 20 MG PO TABS: 20.0000 mg | ORAL_TABLET | Freq: Every day | ORAL | 0 refills | Status: AC

## 2023-10-18 NOTE — Progress Notes (Signed)
 Dear Dr. Schuyler Custard, Here is my assessment for our mutual patient, Kristin Brennan. Thank you for allowing me the opportunity to care for your patient. Please do not hesitate to contact me should you have any other questions. Sincerely, Dr. Milon Aloe  Otolaryngology Clinic Note Referring provider: Dr. Schuyler Custard HPI:  Kristin Brennan is a 73 y.o. female kindly referred by Dr. Schuyler Custard for evaluation of hoarseness.  Initial visit (09/2023): Patient reports: noted hoarseness, incidentally noted by Dr. Schuyler Custard few months ago, patient has not noted it. Does not bother her except with significant voice use, when she does have problems with dysphonia. Better with rest. Worse after she had an intubation in April for Lumpectomy, but now back to baseline. Never completely lost her voice. She has not really tried anything for it. No reflux sx but perhaps mild throat irritation. Does not take anything for it. Did have a cough when smoking it resolved after quitting 3 months ago  Patient otherwise denies: - dysphagia, odynophagia, aspiration episodes or PNA, need for Heimlich, unintentional weight loss - shortness of breath, hemoptysis - ear pain, neck masses  H&N Surgery: no Personal or FHx of bleeding dz or anesthesia difficulty: no  GLP-1: no AP/AC:   Tobacco: 50 PPD, quit 2025. Occupation: Engineer, site and product development  PMHx: HTN, Emphysema, Atherosclerosis, Breast Cancer  Independent Review of Additional Tests or Records:  Dr. Schuyler Custard (04/26/2023) Referral notes reviewed and uploaded or available in chart in media tab: noted chronic non-productive cough; smokes some; chronic hoarseness. Dx: Chronic hoarseness; Rx: Ref to ENT CMP and CBC (04/2023) reviewed and uploaded or available in chart in media tab: BUN/Cr 17/0.67; WBC 6.0, Hgb 14.9, Plt 324 CT Chest reviewed and interpreted (07/31/2023) to evaluate proximal airway: no obvious proximal airway masses seen but suboptimal study given no  contrast; no large (>1cm) pulmonary nodules  PMH/Meds/All/SocHx/FamHx/ROS:   Past Medical History:  Diagnosis Date   Arthritis    fingers   Cancer (HCC) 11/2016   right breast DCIS   Family history of adverse reaction to anesthesia    sister had nausea   Hyperlipidemia    Hypertension    Recurrent breast cancer, right (HCC) 07/2023   Right breast ILC   Smoker    1/2-3/4 PPD     Past Surgical History:  Procedure Laterality Date   BLADDER SUSPENSION     BREAST BIOPSY Right 11/03/2016   BREAST BIOPSY Right 2023   BREAST BIOPSY  09/12/2023   MM RT RADIOACTIVE SEED LOC MAMMO GUIDE 09/12/2023 [facility] MAMMO[facility]   BREAST LUMPECTOMY Right 11/2016   BREAST LUMPECTOMY WITH RADIOACTIVE SEED LOCALIZATION Right 12/18/2016   Procedure: BREAST LUMPECTOMY WITH RADIOACTIVE SEED LOCALIZATION;  Surgeon: Enid Harry, MD;  Location: [facility];  Service: General;  Laterality: Right;   BREAST LUMPECTOMY WITH RADIOACTIVE SEED LOCALIZATION Right 09/13/2023   Procedure: RIGHT BREAST LUMPECTOMY WITH RADIOACTIVE SEED LOCALIZATION;  Surgeon: Enid Harry, MD;  Location: [facility] OR;  Service: General;  Laterality: Right;  Right    Family History  Problem Relation Age of Onset   Breast cancer Mother 3       d.86   Prostate cancer Father 75       d.66   Lung cancer Father 83   Kidney cancer Maternal Aunt        d.72   Prostate cancer Maternal Uncle 82       d.82s   Breast cancer Maternal Grandmother 38       d.92s  Breast cancer Cousin 60       daughter of unaffected maternal uncle Dan   Breast cancer Cousin 46       daughter of unaffected maternal uncle John     Social Connections: Not on file      Current Outpatient Medications:    amLODipine (NORVASC) 5 MG tablet, Take 5 mg by mouth daily., Disp: , Rfl:    anastrozole  (ARIMIDEX ) 1 MG tablet, Take 1 tablet (1 mg total) by mouth daily., Disp: 90 tablet, Rfl: 3   Calcium Carb-Cholecalciferol (CALCIUM 600/VITAMIN D3  PO), Take 1 tablet by mouth daily., Disp: , Rfl:    cholecalciferol (VITAMIN D3) 25 MCG (1000 UNIT) tablet, Take 1,000 Units by mouth daily., Disp: , Rfl:    famotidine  (PEPCID ) 20 MG tablet, Take 1 tablet (20 mg total) by mouth at bedtime., Disp: 30 tablet, Rfl: 0   losartan-hydrochlorothiazide (HYZAAR) 100-25 MG tablet, Take 1 tablet by mouth daily., Disp: , Rfl:    Multiple Vitamins-Minerals (HAIR SKIN & NAILS ADVANCED PO), Take 1 tablet by mouth daily., Disp: , Rfl:    Multiple Vitamins-Minerals (PRESERVISION AREDS 2 PO), Take 1 capsule by mouth in the morning and at bedtime., Disp: , Rfl:    rosuvastatin (CRESTOR) 20 MG tablet, Take 20 mg by mouth daily., Disp: , Rfl:    Physical Exam:   BP 128/79 (BP Location: Left Arm, Patient Position: Sitting, Cuff Size: Normal)   Pulse (!) 59   Ht 5' 4.5" (1.638 m)   Wt 155 lb (70.3 kg)   SpO2 96%   BMI 26.19 kg/m   Salient findings:  CN II-XII intact Bilateral EAC clear and TM intact with well pneumatized middle ear spaces Anterior rhinoscopy: Septum relatively midline; bilateral inferior turbinates without significant hypertrophy No lesions of oral cavity/oropharynx No obviously palpable neck masses/lymphadenopathy/thyromegaly No respiratory distress or stridor; voice quality class 2 - mild harshness; no coughing  Seprately Identifiable Procedures:  Prior to initiating any procedures, risks/benefits/alternatives were explained to the patient and verbal consent obtained. Procedure Note Pre-procedure diagnosis:  Dysphonia, history of smoking, LPR Post-procedure diagnosis: Same Procedure: Transnasal Fiberoptic Laryngoscopy, CPT 31575 - Mod 25 Indication: see above Complications: None apparent EBL: 0 mL  The procedure was undertaken to further evaluate the patient's complaints above, with mirror exam inadequate for appropriate examination due to gag reflex and poor patient tolerance  Procedure:  Patient was identified as correct patient.  Verbal consent was obtained. The nose was sprayed with oxymetazoline and 4% lidocaine . The The flexible laryngoscope was passed through the nose to view the nasal cavity, pharynx (oropharynx, hypopharynx) and larynx.  The larynx was examined at rest and during multiple phonatory tasks. Documentation was obtained and reviewed with patient. The scope was removed. The patient tolerated the procedure well.  Findings: The nasal cavity and nasopharynx did not reveal any masses or lesions, mucosa appeared to be without obvious lesions. The tongue base, pharyngeal walls, piriform sinuses, vallecula, epiglottis and postcricoid region are normal in appearance. The visualized portion of the subglottis and proximal trachea is widely patent. The vocal folds are mobile bilaterally. There are no lesions on the free edge of the vocal folds nor elsewhere in the larynx worrisome for malignancy.  Noted b/l age appropriate VF atrophy, and minimal polypoid change of VF with AP compression consistent with MTD   Electronically signed by: Evelina Hippo, MD 10/18/2023 12:53 PM   Impression & Plans:  Kristin Brennan is a 73 y.o. female with history of  smoking with:  1. Dysphonia   2. Muscle tension dysphonia   3. Laryngopharyngeal reflux (LPR)   4. History of tobacco use    Noted dysphonia but not significantly bothersome. TFL reassuring which patient was glad about. We discussed chronic changes due to smoking and MTD as well as VF atrophy. Could consider voice therapy but patient reports voice adequate for use so not interested in pursuing.  Will empirically treat for LPR with famotidine trial. If not working in 2-3 weeks, can stop; consider barrier agent D/w pt re: f/u, opted PRN  See below regarding exact medications prescribed this encounter including dosages and route: Meds ordered this encounter  Medications   famotidine (PEPCID) 20 MG tablet    Sig: Take 1 tablet (20 mg total) by mouth at bedtime.     Dispense:  30 tablet    Refill:  0      Thank you for allowing me the opportunity to care for your patient. Please do not hesitate to contact me should you have any other questions.  Sincerely, Milon Aloe, MD Otolaryngologist (ENT), [facility] Phone: [phone] Fax: [phone]  10/18/2023, 12:53 PM   MDM:  Level 4 - 99204 Complexity/Problems addressed: mod - chronic problem Data complexity: mod - independent review of notes, labs; independent CT interpretation - Morbidity: mod  - Drug prescribed or managed: y

## 2023-10-23 ENCOUNTER — Telehealth (INDEPENDENT_AMBULATORY_CARE_PROVIDER_SITE_OTHER): Payer: Self-pay

## 2023-10-23 ENCOUNTER — Encounter (INDEPENDENT_AMBULATORY_CARE_PROVIDER_SITE_OTHER): Payer: Self-pay

## 2023-10-23 DIAGNOSIS — R42 Dizziness and giddiness: Secondary | ICD-10-CM

## 2023-10-23 MED ORDER — MECLIZINE HCL 25 MG PO CHEW: 25.0000 mg | CHEWABLE_TABLET | Freq: Three times a day (TID) | ORAL | 0 refills | Status: AC | PRN

## 2023-10-23 MED ORDER — ONDANSETRON 4 MG PO TBDP: 4.0000 mg | ORAL_TABLET | Freq: Three times a day (TID) | ORAL | 0 refills | Status: AC | PRN

## 2023-10-23 NOTE — Telephone Encounter (Signed)
See last telephone note ? ?

## 2023-10-23 NOTE — Addendum Note (Signed)
 Addended by: Jessy Calixte on: 10/23/2023 02:52 PM   Modules accepted: Orders

## 2023-10-23 NOTE — Telephone Encounter (Signed)
 Patient is having some questions about a procedure that was done, she is requesting a call back,  [phone]

## 2023-10-23 NOTE — Telephone Encounter (Signed)
 Attempted to contact patient to schedule appts, patient stated she "does not have her calendar with her at the moment to schedule". Informed patient that she could just call the office back to schedule.

## 2023-12-03 DIAGNOSIS — L218 Other seborrheic dermatitis: Secondary | ICD-10-CM | POA: Diagnosis not present

## 2023-12-22 ENCOUNTER — Ambulatory Visit
Admission: EM | Admit: 2023-12-22 | Discharge: 2023-12-22 | Disposition: A | Discharge status: Home / Self Care | Attending: Family Medicine | Admitting: Family Medicine

## 2023-12-22 DIAGNOSIS — R300 Dysuria: Secondary | ICD-10-CM | POA: Diagnosis not present

## 2023-12-22 DIAGNOSIS — R350 Frequency of micturition: Secondary | ICD-10-CM | POA: Diagnosis not present

## 2023-12-22 DIAGNOSIS — I10 Essential (primary) hypertension: Secondary | ICD-10-CM | POA: Diagnosis not present

## 2023-12-22 DIAGNOSIS — M1990 Unspecified osteoarthritis, unspecified site: Secondary | ICD-10-CM | POA: Diagnosis not present

## 2023-12-22 DIAGNOSIS — E785 Hyperlipidemia, unspecified: Secondary | ICD-10-CM | POA: Diagnosis not present

## 2023-12-22 DIAGNOSIS — R3915 Urgency of urination: Secondary | ICD-10-CM

## 2023-12-22 LAB — POCT URINE DIPSTICK
Bilirubin, UA: NEGATIVE
Blood, UA: NEGATIVE
Glucose, UA: NEGATIVE mg/dL
Ketones, POC UA: NEGATIVE mg/dL
Nitrite, UA: NEGATIVE
POC PROTEIN,UA: NEGATIVE
Spec Grav, UA: <=1.005 — AB (ref 1.010–1.025)
Urobilinogen, UA: 0.2 U/dL
pH, UA: 6 (ref 5.0–8.0)

## 2023-12-22 MED ORDER — PHENAZOPYRIDINE HCL 95 MG PO TABS: 95.0000 mg | ORAL_TABLET | Freq: Three times a day (TID) | ORAL | 0 refills | Status: AC | PRN

## 2023-12-22 MED ORDER — CEPHALEXIN 500 MG PO CAPS: 500.0000 mg | ORAL_CAPSULE | Freq: Two times a day (BID) | ORAL | 0 refills | Status: AC

## 2023-12-22 NOTE — ED Triage Notes (Signed)
 Pt c/o urinary frequency, lower back pain, and burning with urination for 2-3days

## 2023-12-22 NOTE — Discharge Instructions (Signed)
 Take the antibiotics twice daily with food for the next 5 days.  You can take the Pyridium  as needed to help with dysuria.  This may change the color of your urine.  Ensure you are drinking at least 64 ounces of water daily to help flush the kidneys.  Our clinic will reach out if we need to modify your antibiotic treatment.  If no improvement by Monday or Tuesday please follow-up with primary care provider or return to clinic for reevaluation.

## 2023-12-22 NOTE — ED Provider Notes (Signed)
 [facility]    CSN: [id number] Arrival date & time: 12/22/23  0945      History   Chief Complaint Chief Complaint  Patient presents with   Urinary Frequency    HPI Kristin Brennan is a 73 y.o. female.   Patient presents to clinic with concerns of urinary urgency, frequency, flank pain and dysuria.  Urgency and frequency started a few days ago and patient noticed the dysuria last night.  She was up all night with frequent urination.  Does not think she has had any fevers.  Without nausea or vomiting.  History of breast cancer, hyperlipidemia, hypertension and arthritis.  GFR over 60 on labs done in April.  The history is provided by the patient and medical records.  Urinary Frequency    Past Medical History:  Diagnosis Date   Arthritis    fingers   Cancer (HCC) 11/2016   right breast DCIS   Family history of adverse reaction to anesthesia    sister had nausea   Hyperlipidemia    Hypertension    Recurrent breast cancer, right (HCC) 07/2023   Right breast ILC   Smoker    1/2-3/4 PPD    Patient Active Problem List   Diagnosis Date Noted   Malignant neoplasm of upper-outer quadrant of right breast in female, estrogen receptor positive (HCC) 11/20/2016   Weakness 09/22/2016    Past Surgical History:  Procedure Laterality Date   BLADDER SUSPENSION     BREAST BIOPSY Right 11/03/2016   BREAST BIOPSY Right 2023   BREAST BIOPSY  09/12/2023   MM RT RADIOACTIVE SEED LOC MAMMO GUIDE 09/12/2023 [facility]   BREAST LUMPECTOMY Right 11/2016   BREAST LUMPECTOMY WITH RADIOACTIVE SEED LOCALIZATION Right 12/18/2016   Procedure: BREAST LUMPECTOMY WITH RADIOACTIVE SEED LOCALIZATION;  Surgeon: Ebbie Cough, MD;  Location: [facility];  Service: General;  Laterality: Right;   BREAST LUMPECTOMY WITH RADIOACTIVE SEED LOCALIZATION Right 09/13/2023   Procedure: RIGHT BREAST LUMPECTOMY WITH RADIOACTIVE SEED LOCALIZATION;  Surgeon:  Ebbie Cough, MD;  Location[facility];  Service: General;  Laterality: Right;  Right    OB History   No obstetric history on file.      Home Medications    Prior to Admission medications   Medication Sig Start Date End Date Taking? Authorizing Provider  cephALEXin  (KEFLEX ) 500 MG capsule Take 1 capsule (500 mg total) by mouth 2 (two) times daily for 5 days. 12/22/23 12/27/23 Yes Sable Knoles  N, FNP  phenazopyridine  (PYRIDIUM ) 95 MG tablet Take 1 tablet (95 mg total) by mouth 3 (three) times daily as needed for pain. 12/22/23  Yes Tyan Dy  N, FNP  amLODipine (NORVASC) 5 MG tablet Take 5 mg by mouth daily.    [provider]  anastrozole  (ARIMIDEX ) 1 MG tablet Take 1 tablet (1 mg total) by mouth daily. 09/25/23   Gudena, Vinay, MD  Calcium Carb-Cholecalciferol (CALCIUM 600/VITAMIN D3 PO) Take 1 tablet by mouth daily.    [provider]  cholecalciferol (VITAMIN D3) 25 MCG (1000 UNIT) tablet Take 1,000 Units by mouth daily.    [provider]  famotidine  (PEPCID ) 20 MG tablet Take 1 tablet (20 mg total) by mouth at bedtime. 10/18/23   Patel, Kunjan B, MD  losartan-hydrochlorothiazide (HYZAAR) 100-25 MG tablet Take 1 tablet by mouth daily.    [provider]  Multiple Vitamins-Minerals (HAIR SKIN & NAILS ADVANCED PO) Take 1 tablet by mouth daily.    [provider]  Multiple  Vitamins-Minerals (PRESERVISION AREDS 2 PO) Take 1 capsule by mouth in the morning and at bedtime.    [provider]  ondansetron  (ZOFRAN -ODT) 4 MG disintegrating tablet Take 1 tablet (4 mg total) by mouth every 8 (eight) hours as needed for nausea or vomiting. 10/23/23   Patel, Kunjan B, MD  rosuvastatin (CRESTOR) 20 MG tablet Take 20 mg by mouth daily.    [provider]    Family History Family History  Problem Relation Age of Onset   Breast cancer Mother 52       d.86   Prostate cancer Father 20       d.66   Lung cancer Father 51   Kidney  cancer Maternal Aunt        d.72   Prostate cancer Maternal Uncle 82       d.82s   Breast cancer Maternal Grandmother 71       d.92s   Breast cancer Cousin 23       daughter of unaffected maternal uncle Dan   Breast cancer Cousin 58       daughter of unaffected maternal uncle John    Social History Social History   Tobacco Use   Smoking status: Every Day    Current packs/day: 0.50    Types: Cigarettes   Smokeless tobacco: Never   Tobacco comments:    1/2-3/4 ppd  Substance Use Topics   Alcohol use: Yes    Comment: glass of wine 5 times a week   Drug use: No     Allergies   Codeine   Review of Systems Review of Systems  Per HPI  Physical Exam Triage Vital Signs ED Triage Vitals  Encounter Vitals Group     BP 12/22/23 1002 104/63     Girls Systolic BP Percentile --      Girls Diastolic BP Percentile --      Boys Systolic BP Percentile --      Boys Diastolic BP Percentile --      Pulse Rate 12/22/23 1002 (!) 59     Resp 12/22/23 1002 16     Temp 12/22/23 1002 (!) 97.5 F (36.4 C)     Temp Source 12/22/23 1002 Oral     SpO2 12/22/23 1002 93 %     Weight --      Height --      Head Circumference --      Peak Flow --      Pain Score 12/22/23 1004 8     Pain Loc --      Pain Education --      Exclude from Growth Chart --    No data found.  Updated Vital Signs BP 104/63 (BP Location: Right Arm)   Pulse (!) 59   Temp (!) 97.5 F (36.4 C) (Oral)   Resp 16   SpO2 93%   Visual Acuity Right Eye Distance:   Left Eye Distance:   Bilateral Distance:    Right Eye Near:   Left Eye Near:    Bilateral Near:     Physical Exam Vitals and nursing note reviewed.  Constitutional:      Appearance: Normal appearance.  HENT:     Head: Normocephalic and atraumatic.     Right Ear: External ear normal.     Left Ear: External ear normal.     Nose: Nose normal.     Mouth/Throat:     Mouth: Mucous membranes are moist.  Eyes:  Conjunctiva/sclera:  Conjunctivae normal.  Cardiovascular:     Rate and Rhythm: Normal rate.  Pulmonary:     Effort: Pulmonary effort is normal. No respiratory distress.  Abdominal:     Tenderness: There is no right CVA tenderness or left CVA tenderness.  Skin:    General: Skin is warm and dry.  Neurological:     General: No focal deficit present.     Mental Status: She is alert and oriented to person, place, and time.  Psychiatric:        Mood and Affect: Mood normal.        Behavior: Behavior normal.      UC Treatments / Results  Labs (all labs ordered are listed, but only abnormal results are displayed) Labs Reviewed  POCT URINE DIPSTICK - Abnormal; Notable for the following components:      Result Value   Spec Grav, UA <=1.005 (*)    Leukocytes, UA Trace (*)    All other components within normal limits  URINE CULTURE    EKG   Radiology No results found.  Procedures Procedures (including critical care time)  Medications Ordered in UC Medications - No data to display  Initial Impression / Assessment and Plan / UC Course  I have reviewed the triage vital signs and the nursing notes.  Pertinent labs & imaging results that were available during my care of the patient were reviewed by me and considered in my medical decision making (see chart for details).  Vitals and triage reviewed, patient is hemodynamically stable.  Afebrile, without tachycardia and negative for CVA tenderness, low concern for pyelonephritis at this time.  UA shows trace leuks, anticoagulation with age, immunocompromise status, and symptoms we will treat for acute cystitis with Keflex .  Normal renal function.  Urine sent for culture and contact if treatment modification is needed.  Plan of care, follow-up care return precautions given, no questions at this time.     Final Clinical Impressions(s) / UC Diagnoses   Final diagnoses:  Urinary frequency  Urgency of urination  Dysuria     Discharge Instructions       Take the antibiotics twice daily with food for the next 5 days.  You can take the Pyridium  as needed to help with dysuria.  This may change the color of your urine.  Ensure you are drinking at least 64 ounces of water daily to help flush the kidneys.  Our clinic will reach out if we need to modify your antibiotic treatment.  If no improvement by Monday or Tuesday please follow-up with primary care provider or return to clinic for reevaluation.     ED Prescriptions     Medication Sig Dispense Auth. Provider   cephALEXin  (KEFLEX ) 500 MG capsule Take 1 capsule (500 mg total) by mouth 2 (two) times daily for 5 days. 10 capsule Dreama, Joeangel Jeanpaul  N, FNP   phenazopyridine  (PYRIDIUM ) 95 MG tablet Take 1 tablet (95 mg total) by mouth 3 (three) times daily as needed for pain. 10 tablet Dreama, Caelen Reierson  N, FNP      PDMP not reviewed this encounter.   Dreama Meg SAILOR, FNP 12/22/23 1043

## 2023-12-24 ENCOUNTER — Ambulatory Visit (HOSPITAL_COMMUNITY): Payer: Self-pay

## 2023-12-24 LAB — URINE CULTURE: Culture: >=100000 — AB

## 2023-12-25 MED ORDER — FLUCONAZOLE 150 MG PO TABS: 150.0000 mg | ORAL_TABLET | Freq: Once | ORAL | 0 refills | Status: AC

## 2023-12-26 ENCOUNTER — Inpatient Hospital Stay: Admitting: Adult Health

## 2024-03-25 ENCOUNTER — Inpatient Hospital Stay: Admitting: Adult Health

## 2024-04-05 ENCOUNTER — Other Ambulatory Visit: Payer: Self-pay

## 2024-04-05 ENCOUNTER — Ambulatory Visit
Admission: EM | Admit: 2024-04-05 | Discharge: 2024-04-05 | Disposition: A | Discharge status: Home / Self Care | Attending: Physician Assistant | Admitting: Physician Assistant

## 2024-04-05 DIAGNOSIS — R399 Unspecified symptoms and signs involving the genitourinary system: Secondary | ICD-10-CM

## 2024-04-05 LAB — POCT URINE DIPSTICK
Bilirubin, UA: NEGATIVE
Glucose, UA: NEGATIVE mg/dL
Ketones, POC UA: NEGATIVE mg/dL
Nitrite, UA: NEGATIVE
POC PROTEIN,UA: NEGATIVE
Spec Grav, UA: 1.015 (ref 1.010–1.025)
Urobilinogen, UA: 0.2 U/dL
pH, UA: 6 (ref 5.0–8.0)

## 2024-04-05 MED ORDER — CEPHALEXIN 500 MG PO CAPS: 500.0000 mg | ORAL_CAPSULE | Freq: Four times a day (QID) | ORAL | 0 refills | Status: AC

## 2024-04-05 NOTE — ED Provider Notes (Signed)
 [facility]    CSN: [id number] Arrival date & time: 04/05/24  1337      History   Chief Complaint Chief Complaint  Patient presents with   Urinary Frequency   Dysuria    HPI Kristin Brennan is a 73 y.o. female.  has a past medical history of Arthritis, Cancer (HCC) (11/2016), Family history of adverse reaction to anesthesia, Hyperlipidemia, Hypertension, Recurrent breast cancer, right (HCC) (07/2023), and Smoker.   HPI  Discussed the use of AI scribe software for clinical note transcription with the patient, who gave verbal consent to proceed. The patient presents with symptoms of a urinary tract infection.  Symptoms include a burning sensation during urination and increased frequency, which began yesterday and worsened today. She also experiences chills but does not believe she currently has a fever. No flank pain, abdominal pain, or difficulty urinating such as straining. There is no vaginal pain, bleeding, or discharge.  She mentions a previous urinary tract infection treated in August, noting that it had been years since her last infection before these recent occurrences. She was treated with Keflex  previously and tolerated it well. She has not used Pyridium  before but has some available at home.    Past Medical History:  Diagnosis Date   Arthritis    fingers   Cancer (HCC) 11/2016   right breast DCIS   Family history of adverse reaction to anesthesia    sister had nausea   Hyperlipidemia    Hypertension    Recurrent breast cancer, right (HCC) 07/2023   Right breast ILC   Smoker    1/2-3/4 PPD    Patient Active Problem List   Diagnosis Date Noted   Malignant neoplasm of upper-outer quadrant of right breast in female, estrogen receptor positive (HCC) 11/20/2016   Weakness 09/22/2016    Past Surgical History:  Procedure Laterality Date   BLADDER SUSPENSION     BREAST BIOPSY Right 11/03/2016   BREAST BIOPSY Right 2023   BREAST BIOPSY   09/12/2023   MM RT RADIOACTIVE SEED LOC MAMMO GUIDE 09/12/2023 [facility]MO[facility]   BREAST LUMPECTOMY Right 11/2016   BREAST LUMPECTOMY WITH RADIOACTIVE SEED LOCALIZATION Right 12/18/2016   Procedure: BREAST LUMPECTOMY WITH RADIOACTIVE SEED LOCALIZATION;  Surgeon: Ebbie Cough, MD;  Location: [facility];  Service: General;  Laterality: Right;   BREAST LUMPECTOMY WITH RADIOACTIVE SEED LOCALIZATION Right 09/13/2023   Procedure: RIGHT BREAST LUMPECTOMY WITH RADIOACTIVE SEED LOCALIZATION;  Surgeon: Ebbie Cough, MD;  Location: [facility] OR;  Service: General;  Laterality: Right;  Right    OB History   No obstetric history on file.      Home Medications    Prior to Admission medications   Medication Sig Start Date End Date Taking? Authorizing Provider  cephALEXin  (KEFLEX ) 500 MG capsule Take 1 capsule (500 mg total) by mouth 4 (four) times daily for 5 days. 04/05/24 04/10/24 Yes Oshua Mcconaha E, PA-C  amLODipine (NORVASC) 5 MG tablet Take 5 mg by mouth daily.    [provider]  anastrozole  (ARIMIDEX ) 1 MG tablet Take 1 tablet (1 mg total) by mouth daily. 09/25/23   Gudena, Vinay, MD  Calcium Carb-Cholecalciferol (CALCIUM 600/VITAMIN D3 PO) Take 1 tablet by mouth daily.    [provider]  cholecalciferol (VITAMIN D3) 25 MCG (1000 UNIT) tablet Take 1,000 Units by mouth daily.    [provider]  famotidine  (PEPCID ) 20 MG tablet Take 1 tablet (20 mg total) by mouth at bedtime. 10/18/23  Tobie Eldora NOVAK, MD  losartan-hydrochlorothiazide (HYZAAR) 100-25 MG tablet Take 1 tablet by mouth daily.    [provider]  Multiple Vitamins-Minerals (HAIR SKIN & NAILS ADVANCED PO) Take 1 tablet by mouth daily.    [provider]  Multiple Vitamins-Minerals (PRESERVISION AREDS 2 PO) Take 1 capsule by mouth in the morning and at bedtime.    [provider]  ondansetron  (ZOFRAN -ODT) 4 MG disintegrating tablet Take 1 tablet (4 mg total) by mouth  every 8 (eight) hours as needed for nausea or vomiting. 10/23/23   Tobie Eldora NOVAK, MD  phenazopyridine  (PYRIDIUM ) 95 MG tablet Take 1 tablet (95 mg total) by mouth 3 (three) times daily as needed for pain. 12/22/23   Dreama, [place]  N, FNP  rosuvastatin (CRESTOR) 20 MG tablet Take 20 mg by mouth daily.    [provider]    Family History Family History  Problem Relation Age of Onset   Breast cancer Mother 9       d.86   Prostate cancer Father 36       d.66   Lung cancer Father 33   Kidney cancer Maternal Aunt        d.72   Prostate cancer Maternal Uncle 82       d.82s   Breast cancer Maternal Grandmother 55       d.92s   Breast cancer Cousin 44       daughter of unaffected maternal uncle Dan   Breast cancer Cousin 25       daughter of unaffected maternal uncle John    Social History Social History   Tobacco Use   Smoking status: Every Day    Current packs/day: 0.50    Types: Cigarettes   Smokeless tobacco: Never   Tobacco comments:    1/2-3/4 ppd  Vaping Use   Vaping status: Never Used  Substance Use Topics   Alcohol use: Yes    Comment: glass of wine 5 times a week   Drug use: No     Allergies   Codeine   Review of Systems Review of Systems  Constitutional:  Positive for chills. Negative for fever.  Gastrointestinal:  Negative for abdominal pain.  Genitourinary:  Positive for dysuria, frequency and urgency. Negative for difficulty urinating, flank pain, hematuria, vaginal bleeding, vaginal discharge and vaginal pain.     Physical Exam Triage Vital Signs ED Triage Vitals  Encounter Vitals Group     BP 04/05/24 1351 118/70     Girls Systolic BP Percentile --      Girls Diastolic BP Percentile --      Boys Systolic BP Percentile --      Boys Diastolic BP Percentile --      Pulse Rate 04/05/24 1351 71     Resp 04/05/24 1351 20     Temp 04/05/24 1351 97.7 F (36.5 C)     Temp Source 04/05/24 1351 Oral     SpO2 04/05/24 1351 95 %     Weight  04/05/24 1351 156 lb (70.8 kg)     Height 04/05/24 1351 5' 4.5 (1.638 m)     Head Circumference --      Peak Flow --      Pain Score 04/05/24 1400 6     Pain Loc --      Pain Education --      Exclude from Growth Chart --    No data found.  Updated Vital Signs BP 118/70 (BP Location: Right Arm)  Pulse 71   Temp 97.7 F (36.5 C) (Oral)   Resp 20   Ht 5' 4.5 (1.638 m)   Wt 156 lb (70.8 kg)   SpO2 95%   BMI 26.36 kg/m   Visual Acuity Right Eye Distance:   Left Eye Distance:   Bilateral Distance:    Right Eye Near:   Left Eye Near:    Bilateral Near:     Physical Exam Vitals reviewed.  Constitutional:      General: She is awake.     Appearance: Normal appearance. She is well-developed and well-groomed.  HENT:     Head: Normocephalic and atraumatic.  Eyes:     General: Lids are normal. Gaze aligned appropriately.     Extraocular Movements: Extraocular movements intact.     Conjunctiva/sclera: Conjunctivae normal.  Pulmonary:     Effort: Pulmonary effort is normal.  Neurological:     Mental Status: She is alert and oriented to person, place, and time.  Psychiatric:        Attention and Perception: Attention and perception normal.        Mood and Affect: Mood and affect normal.        Speech: Speech normal.        Behavior: Behavior normal. Behavior is cooperative.        Thought Content: Thought content normal.        Judgment: Judgment normal.      UC Treatments / Results  Labs (all labs ordered are listed, but only abnormal results are displayed) Labs Reviewed  POCT URINE DIPSTICK - Abnormal; Notable for the following components:      Result Value   Color, UA light yellow (*)    Clarity, UA cloudy (*)    Blood, UA trace-lysed (*)    Leukocytes, UA Small (1+) (*)    All other components within normal limits  URINE CULTURE    EKG   Radiology No results found.  Procedures Procedures (including critical care time)  Medications Ordered in  UC Medications - No data to display  Initial Impression / Assessment and Plan / UC Course  I have reviewed the triage vital signs and the nursing notes.  Pertinent labs & imaging results that were available during my care of the patient were reviewed by me and considered in my medical decision making (see chart for details).     Patient was recently seen at this urgent care on 12/22/2023 for concerns of UTI.  She was diagnosed with pansensitive E. coli UTI and treated successfully with Keflex .  Final Clinical Impressions(s) / UC Diagnoses   Final diagnoses:  Urinary tract infection symptoms   Urinary tract infection Acute urinary tract infection with dysuria, frequency, chills, and hematuria. Symptoms began yesterday and worsened today. Urinalysis shows red and white blood cells, consistent with UTI. Previous UTI treated successfully with Keflex . Differential includes vaginal atrophy or persistent infection, but less likely due to previous antibiotic susceptibility and resolution. - Sent urine for culture - Prescribed Keflex  - Offered Pyridium  for discomfort but pt declined today  - Advised to complete the full course of antibiotics    Discharge Instructions      Based on your symptoms and results of the urinalysis I believe you have a UTI I recommend the following:  I have sent in a script for Keflex  500 mg to be taken by mouth every 6 hours for 5 days  Please finish the entire course of the antibiotic even if you are  feeling better before it is completed unless you develop an allergic reaction or are told by a medical provider to stop taking it. We have sent a sample of your urine off for a urine culture.  This will help us  determine what bacteria is causing your symptoms as well as the most appropriate antibiotic to treat it.  If we need to make any adjustments to your medication regimen and new medication will be sent to the pharmacy on file and you will be updated via phone call  in MyChart. Stay well hydrated (at least 75 oz of water per day) and avoid holding your urine for prolonged periods of time. If you have any of the following please return to urgent care or go to the emergency room: Persistent symptoms, fever, trouble urinating or inability to urinate, confusion, flank pain.       ED Prescriptions     Medication Sig Dispense Auth. Provider   cephALEXin  (KEFLEX ) 500 MG capsule Take 1 capsule (500 mg total) by mouth 4 (four) times daily for 5 days. 20 capsule Mailynn Everly E, PA-C      PDMP not reviewed this encounter.   Marylene Rocky BRAVO, PA-C 04/05/24 1458

## 2024-04-05 NOTE — ED Triage Notes (Signed)
 Pt presents with complaints of urinary frequency/urgency and burning with urination. Symptoms began yesterday. Currently rates overall pain a 6/10. Pain increases with urination. No OTC medications taken PTA for symptoms reported.

## 2024-04-05 NOTE — Discharge Instructions (Addendum)
 Based on your symptoms and results of the urinalysis I believe you have a UTI I recommend the following:  I have sent in a script for Keflex  500 mg to be taken by mouth every 6 hours for 5 days  Please finish the entire course of the antibiotic even if you are feeling better before it is completed unless you develop an allergic reaction or are told by a medical provider to stop taking it. We have sent a sample of your urine off for a urine culture.  This will help us  determine what bacteria is causing your symptoms as well as the most appropriate antibiotic to treat it.  If we need to make any adjustments to your medication regimen and new medication will be sent to the pharmacy on file and you will be updated via phone call in MyChart. Stay well hydrated (at least 75 oz of water per day) and avoid holding your urine for prolonged periods of time. If you have any of the following please return to urgent care or go to the emergency room: Persistent symptoms, fever, trouble urinating or inability to urinate, confusion, flank pain.

## 2024-04-07 ENCOUNTER — Ambulatory Visit (HOSPITAL_COMMUNITY): Payer: Self-pay

## 2024-04-07 DIAGNOSIS — H52203 Unspecified astigmatism, bilateral: Secondary | ICD-10-CM | POA: Diagnosis not present

## 2024-04-07 DIAGNOSIS — H2513 Age-related nuclear cataract, bilateral: Secondary | ICD-10-CM | POA: Diagnosis not present

## 2024-04-07 LAB — URINE CULTURE: Culture: >=100000 — AB

## 2024-04-08 ENCOUNTER — Other Ambulatory Visit: Payer: Self-pay | Admitting: Hematology and Oncology

## 2024-04-08 DIAGNOSIS — Z853 Personal history of malignant neoplasm of breast: Secondary | ICD-10-CM

## 2024-04-24 ENCOUNTER — Ambulatory Visit
Admission: RE | Admit: 2024-04-24 | Discharge: 2024-04-24 | Disposition: A | Source: Ambulatory Visit | Attending: Hematology and Oncology

## 2024-04-24 DIAGNOSIS — R928 Other abnormal and inconclusive findings on diagnostic imaging of breast: Secondary | ICD-10-CM | POA: Diagnosis not present

## 2024-04-24 DIAGNOSIS — Z853 Personal history of malignant neoplasm of breast: Secondary | ICD-10-CM

## 2024-04-25 DIAGNOSIS — I2510 Atherosclerotic heart disease of native coronary artery without angina pectoris: Secondary | ICD-10-CM | POA: Diagnosis not present

## 2024-04-25 DIAGNOSIS — I10 Essential (primary) hypertension: Secondary | ICD-10-CM | POA: Diagnosis not present

## 2024-05-01 DIAGNOSIS — Z0000 Encounter for general adult medical examination without abnormal findings: Secondary | ICD-10-CM | POA: Diagnosis not present

## 2024-05-01 DIAGNOSIS — E559 Vitamin D deficiency, unspecified: Secondary | ICD-10-CM | POA: Diagnosis not present

## 2024-05-01 DIAGNOSIS — F322 Major depressive disorder, single episode, severe without psychotic features: Secondary | ICD-10-CM | POA: Diagnosis not present

## 2024-05-01 DIAGNOSIS — Z170 Estrogen receptor positive status [ER+]: Secondary | ICD-10-CM | POA: Diagnosis not present

## 2024-05-01 DIAGNOSIS — Z23 Encounter for immunization: Secondary | ICD-10-CM | POA: Diagnosis not present

## 2024-05-01 DIAGNOSIS — I2510 Atherosclerotic heart disease of native coronary artery without angina pectoris: Secondary | ICD-10-CM | POA: Diagnosis not present

## 2024-05-01 DIAGNOSIS — I10 Essential (primary) hypertension: Secondary | ICD-10-CM | POA: Diagnosis not present

## 2024-05-01 DIAGNOSIS — E7800 Pure hypercholesterolemia, unspecified: Secondary | ICD-10-CM | POA: Diagnosis not present

## 2024-05-01 DIAGNOSIS — F17210 Nicotine dependence, cigarettes, uncomplicated: Secondary | ICD-10-CM | POA: Diagnosis not present

## 2024-08-19 ENCOUNTER — Inpatient Hospital Stay: Admitting: Adult Health

## 2024-08-19 ENCOUNTER — Inpatient Hospital Stay
